# Patient Record
Sex: Male | Born: 1995 | Race: White | Hispanic: No | Smoking: Never smoker
Health system: Southern US, Community
[De-identification: ages and names within clinical notes are randomized; demographics above are authoritative.]

## PROBLEM LIST (undated history)

## (undated) DIAGNOSIS — Z8042 Family history of malignant neoplasm of prostate: Secondary | ICD-10-CM

## (undated) DIAGNOSIS — R0789 Other chest pain: Secondary | ICD-10-CM

## (undated) DIAGNOSIS — Z801 Family history of malignant neoplasm of trachea, bronchus and lung: Secondary | ICD-10-CM

## (undated) DIAGNOSIS — G8929 Other chronic pain: Secondary | ICD-10-CM

## (undated) DIAGNOSIS — Z8051 Family history of malignant neoplasm of kidney: Secondary | ICD-10-CM

## (undated) DIAGNOSIS — Z800 Family history of malignant neoplasm of digestive organs: Secondary | ICD-10-CM

## (undated) DIAGNOSIS — Q677 Pectus carinatum: Secondary | ICD-10-CM

## (undated) DIAGNOSIS — I517 Cardiomegaly: Secondary | ICD-10-CM

## (undated) DIAGNOSIS — J45909 Unspecified asthma, uncomplicated: Secondary | ICD-10-CM

## (undated) DIAGNOSIS — K219 Gastro-esophageal reflux disease without esophagitis: Secondary | ICD-10-CM

## (undated) HISTORY — DX: Family history of malignant neoplasm of kidney: Z80.51

## (undated) HISTORY — PX: HERNIA REPAIR: SHX51

## (undated) HISTORY — DX: Other chest pain: R07.89

## (undated) HISTORY — DX: Family history of malignant neoplasm of digestive organs: Z80.0

## (undated) HISTORY — PX: OTHER SURGICAL HISTORY: SHX169

## (undated) HISTORY — DX: Family history of malignant neoplasm of prostate: Z80.42

## (undated) HISTORY — DX: Family history of malignant neoplasm of trachea, bronchus and lung: Z80.1

## (undated) HISTORY — PX: CHOLECYSTECTOMY: SHX55

---
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
## 2018-01-05 ENCOUNTER — Encounter (HOSPITAL_COMMUNITY): Payer: Self-pay | Admitting: Emergency Medicine

## 2018-01-05 ENCOUNTER — Emergency Department (HOSPITAL_COMMUNITY)
Admission: EM | Admit: 2018-01-05 | Discharge: 2018-01-05 | Disposition: A | Discharge status: Home / Self Care | Payer: Self-pay | Attending: Emergency Medicine | Admitting: Emergency Medicine

## 2018-01-05 DIAGNOSIS — Z79899 Other long term (current) drug therapy: Secondary | ICD-10-CM | POA: Insufficient documentation

## 2018-01-05 DIAGNOSIS — M545 Low back pain, unspecified: Secondary | ICD-10-CM

## 2018-01-05 MED ORDER — IBUPROFEN 800 MG PO TABS: 800.0000 mg | ORAL_TABLET | Freq: Three times a day (TID) | ORAL | 0 refills | Status: DC

## 2018-01-05 MED ORDER — IBUPROFEN 800 MG PO TABS
800.0000 mg | ORAL_TABLET | Freq: Once | ORAL | Status: AC
  Administered 2018-01-05: 800 mg via ORAL
  Filled 2018-01-05: qty 1

## 2018-01-05 MED ORDER — CYCLOBENZAPRINE HCL 10 MG PO TABS: 10.0000 mg | ORAL_TABLET | Freq: Two times a day (BID) | ORAL | 0 refills | Status: DC | PRN

## 2018-01-05 MED ORDER — CYCLOBENZAPRINE HCL 10 MG PO TABS
10.0000 mg | ORAL_TABLET | Freq: Once | ORAL | Status: AC
  Administered 2018-01-05: 10 mg via ORAL
  Filled 2018-01-05: qty 1

## 2018-01-05 NOTE — ED Provider Notes (Signed)
[facility] EMERGENCY DEPARTMENT Provider Note   CSN: [id number] Arrival date & time: 01/05/18  1801     History   Chief Complaint Chief Complaint  Patient presents with  . Back Pain    HPI Devin Smith is a 22 y.o. male presenting with right lower back pain which has been achy x 1 week but became more severe and sharp today and is consistent with the pain from a fall from a scissors lift at work about 4 months ago.  He reports being evaluated at [facility] and imaging was negative for acute bony injury.  He was treated with ibuprofen and flexeril which were very helpful.   Patient denies any new injury as the source of todays pain but does have to bend and walk frequently with his job.  There is no radiation of pain into his legs.  There has been no weakness or numbness in the lower extremities and no urinary or bowel retention or incontinence.  Patient does not have a history of cancer or IVDU.  The patient has had no medications prior to arrival.  The history is provided by the patient.    Past Medical History:  Diagnosis Date  . Cardiomegaly   . Chest wall pain, chronic   . Pectus carinatum     Patient Active Problem List   Diagnosis Date Noted  . Cardiomegaly 05/22/2015    Past Surgical History:  Procedure Laterality Date  . Ravitch for pecuts carinatum on 10/14/2011          Home Medications    Prior to Admission medications   Medication Sig Start Date End Date Taking? Authorizing Provider  cyclobenzaprine (FLEXERIL) 10 MG tablet Take 1 tablet (10 mg total) by mouth 2 (two) times daily as needed for muscle spasms. 01/05/18   Burgess Amor, PA-C  ibuprofen (ADVIL,MOTRIN) 800 MG tablet Take 1 tablet (800 mg total) by mouth 3 (three) times daily. 01/05/18   Burgess Amor, PA-C  methocarbamol (ROBAXIN) 500 MG tablet Take 2 tablets (1,000 mg total) by mouth 4 (four) times daily as needed for muscle spasms (muscle spasm/pain). Patient not taking: Reported on 08/31/2016  07/20/16   Samuel Jester, DO  ondansetron (ZOFRAN ODT) 8 MG disintegrating tablet Take 1 tablet (8 mg total) by mouth every 8 (eight) hours as needed for nausea or vomiting. 08/31/16   Linwood Dibbles, MD  predniSONE (DELTASONE) 20 MG tablet Take 20 mg by mouth 2 (two) times daily.    [provider]    Family History Family History  Problem Relation Age of Onset  . Hypertension Mother   . Diabetes Mother   . Kidney cancer Mother   . Hyperlipidemia Mother   . Stomach cancer Father   . Brain cancer Father   . Hypertension Father   . Hyperlipidemia Father   . Thyroid disease Sister   . Heart failure Maternal Grandmother   . Heart disease Maternal Grandmother   . Heart disease Paternal Grandmother   . Heart failure Paternal Grandmother     Social History Social History   Tobacco Use  . Smoking status: Never Smoker  . Smokeless tobacco: Never Used  Substance Use Topics  . Alcohol use: No  . Drug use: No     Allergies   Patient has no known allergies.   Review of Systems Review of Systems  Constitutional: Negative for fever.  Respiratory: Negative for shortness of breath.   Cardiovascular: Negative for chest pain and leg swelling.  Gastrointestinal: Negative  for abdominal distention, abdominal pain and constipation.  Genitourinary: Negative for difficulty urinating, dysuria, flank pain, frequency and urgency.  Musculoskeletal: Positive for back pain. Negative for gait problem and joint swelling.  Skin: Negative for rash.  Neurological: Negative for weakness and numbness.     Physical Exam Updated Vital Signs BP 127/64 (BP Location: Right Arm)   Pulse 98   Temp 97.8 F (36.6 C) (Oral)   Resp 18   Ht 6\' 3"  (1.905 m)   Wt 136.1 kg   SpO2 100%   BMI 37.50 kg/m   Physical Exam  Constitutional: He appears well-developed and well-nourished.  HENT:  Head: Normocephalic.  Eyes: Conjunctivae are normal.  Neck: Normal range of motion. Neck supple.    Cardiovascular: Normal rate and intact distal pulses.  Pedal pulses normal.  Pulmonary/Chest: Effort normal.  Abdominal: Soft. Bowel sounds are normal. He exhibits no distension.  Musculoskeletal: Normal range of motion. He exhibits no edema.       Lumbar back: He exhibits bony tenderness. He exhibits no swelling, no edema and no spasm.       Back:  Patient has tenderness to palpation midline lower lumbar with radiation toward the right lower soft tissue.  There is no palpable deformity, edema or erythema.  Neurological: He is alert. He has normal strength. He displays no atrophy and no tremor. No sensory deficit. Gait normal.  Reflex Scores:      Patellar reflexes are 2+ on the right side and 2+ on the left side.      Achilles reflexes are 2+ on the right side and 2+ on the left side. No strength deficit noted in hip and knee flexor and extensor muscle groups.  Ankle flexion and extension intact.  Skin: Skin is warm and dry.  Psychiatric: He has a normal mood and affect.  Nursing note and vitals reviewed.    ED Treatments / Results  Labs (all labs ordered are listed, but only abnormal results are displayed) Labs Reviewed - No data to display  EKG None  Radiology No results found.  Procedures Procedures (including critical care time)  Medications Ordered in ED Medications  ibuprofen (ADVIL,MOTRIN) tablet 800 mg (800 mg Oral Given 01/05/18 1846)  cyclobenzaprine (FLEXERIL) tablet 10 mg (10 mg Oral Given 01/05/18 1845)     Initial Impression / Assessment and Plan / ED Course  I have reviewed the triage vital signs and the nursing notes.  Pertinent labs & imaging results that were available during my care of the patient were reviewed by me and considered in my medical decision making (see chart for details).     No neuro deficit on exam or by history to suggest emergent or surgical presentation.  Also discussed worsened sx that should prompt immediate re-evaluation  including distal weakness, bowel/bladder retention/incontinence.  Patient was advised ice and heat therapy, follow-up with his PCP for recheck within 1 week if not resolved.        Final Clinical Impressions(s) / ED Diagnoses   Final diagnoses:  Acute right-sided low back pain without sciatica    ED Discharge Orders         Ordered    cyclobenzaprine (FLEXERIL) 10 MG tablet  2 times daily PRN     01/05/18 1846    ibuprofen (ADVIL,MOTRIN) 800 MG tablet  3 times daily     01/05/18 1846           Burgess Amor, PA-C 01/05/18 1846    Loren Racer, MD  01/07/18 1606  

## 2018-01-05 NOTE — Discharge Instructions (Signed)
Do not drive within 4 hours of taking flexeril  as this can make you drowsy.  Avoid lifting,  Bending,  Twisting or any other activity that worsens your pain over the next week.  Apply an  icepack  to your lower back for 10-15 minutes every 2 hours for the next 2 days, you may then try applying a heating pad for 20 minutes several times daily.  You should get rechecked if your symptoms are not better over the next 5 days,  Or you develop increased pain,  Weakness in your leg(s) or loss of bladder or bowel function - these are symptoms of a worse injury.

## 2018-01-05 NOTE — ED Triage Notes (Signed)
Low back pain x 3 months since falling off machine at work.  Pt states it has gotten worse in the last 2 days

## 2018-05-16 IMAGING — CT CT RENAL STONE PROTOCOL
2 of 5 series · 16 of 46 positions shown, 18 images · non-contrast
Comparison: None.

CLINICAL DATA: Acute onset of left groin pain.  Initial encounter.

EXAM:
CT ABDOMEN AND PELVIS WITHOUT CONTRAST
TECHNIQUE: Multidetector CT imaging of the abdomen and pelvis was performed
following the standard protocol without IV contrast.

[Series 3: axial st · axial · 0.78mm/px · z∈[+821,+1296]mm · 13 of 107 slices shown, 15 images]
[im 6/107  soft-tissue]
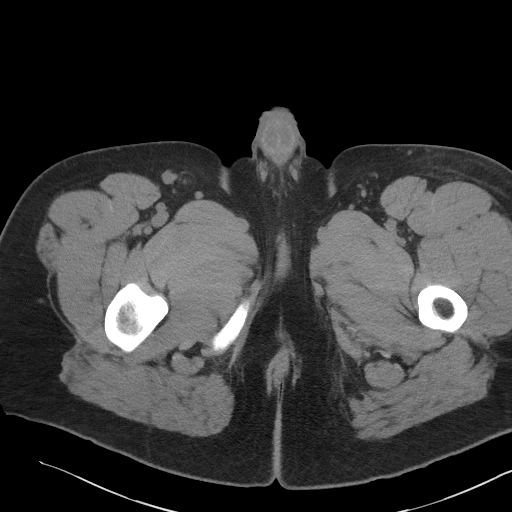
[im 6/107  bone]
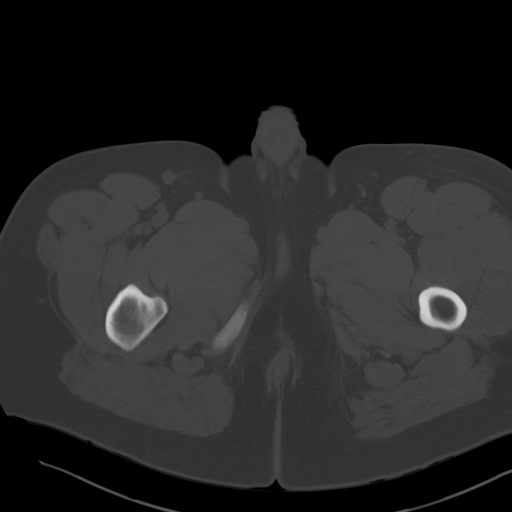
[im 16/107  soft-tissue]
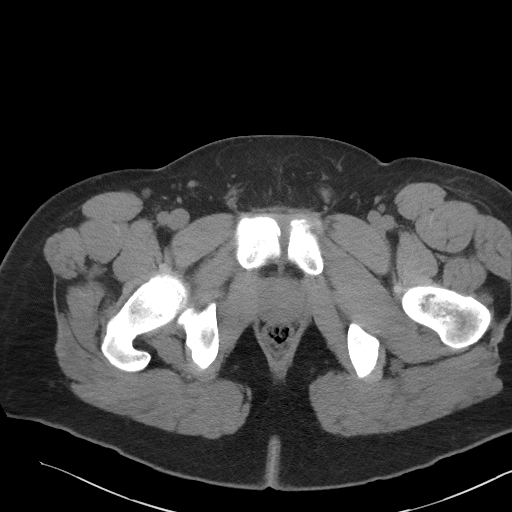
[im 22/107  soft-tissue]
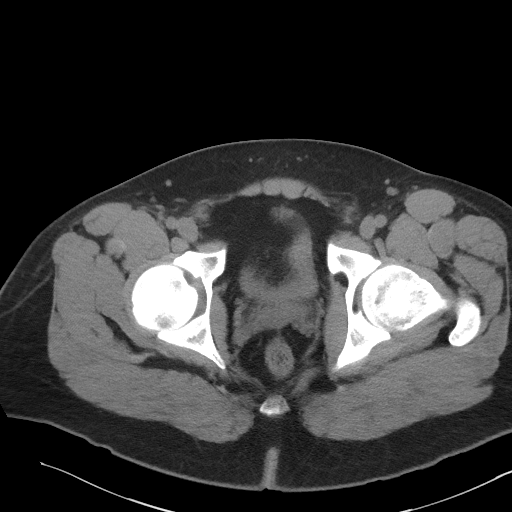
[im 32/107  soft-tissue]
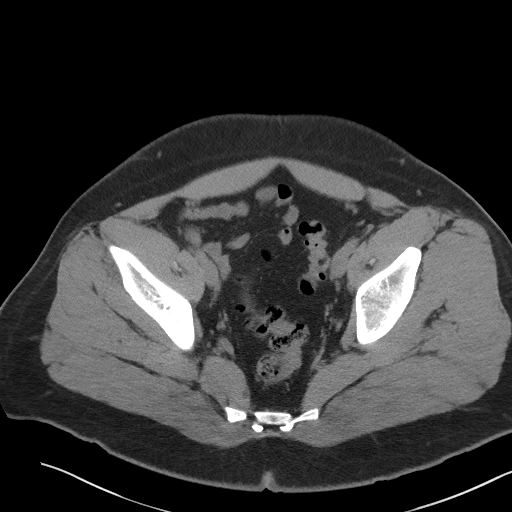
[im 38/107  soft-tissue]
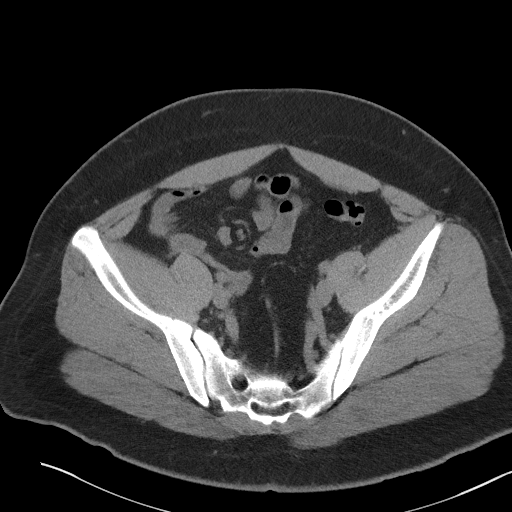
[im 48/107  soft-tissue]
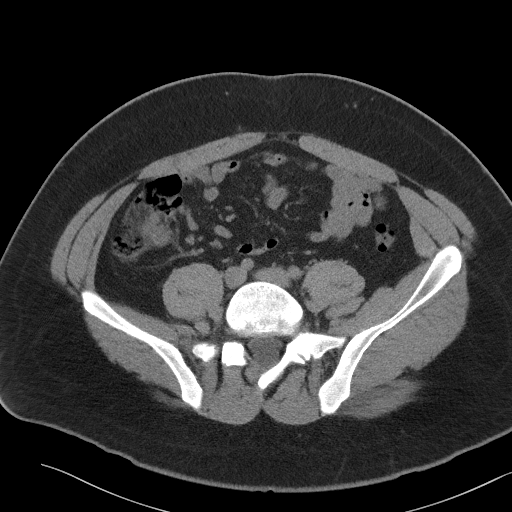
[im 54/107  soft-tissue]
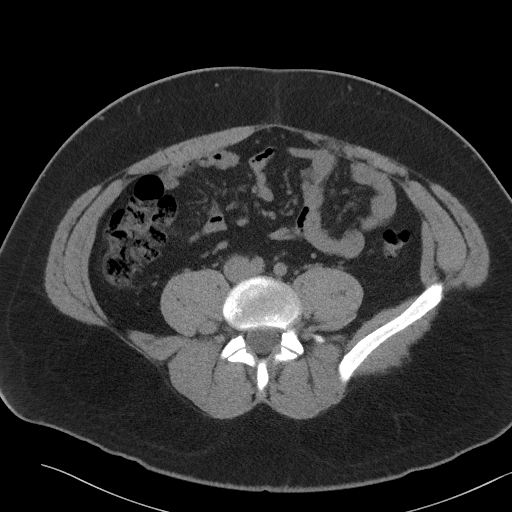
[im 59/107  soft-tissue]
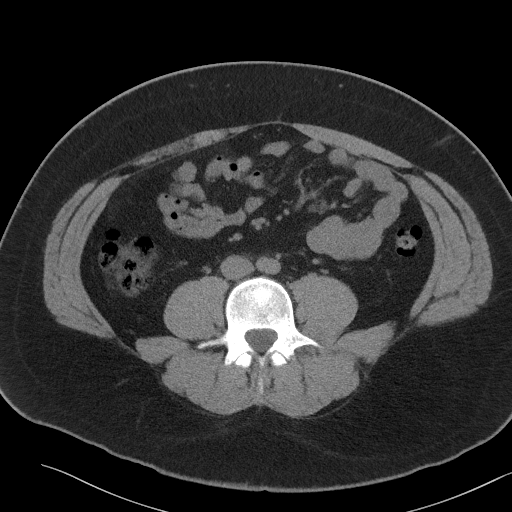
[im 69/107  soft-tissue]
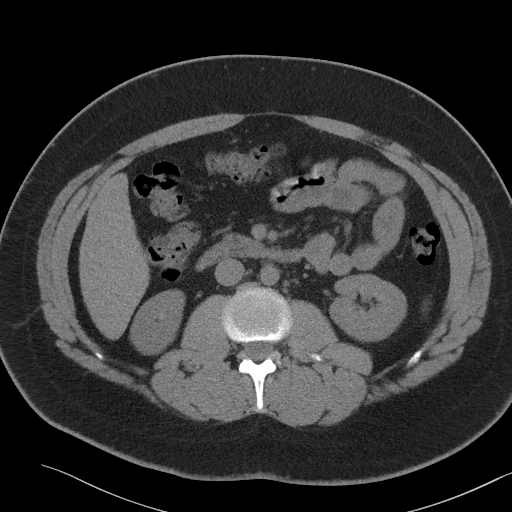
[im 69/107  bone]
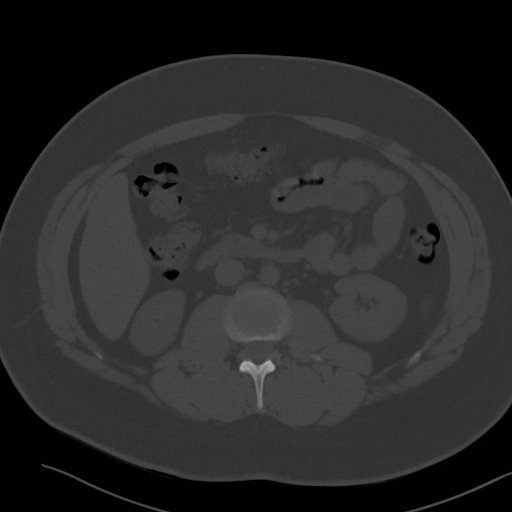
[im 75/107  soft-tissue]
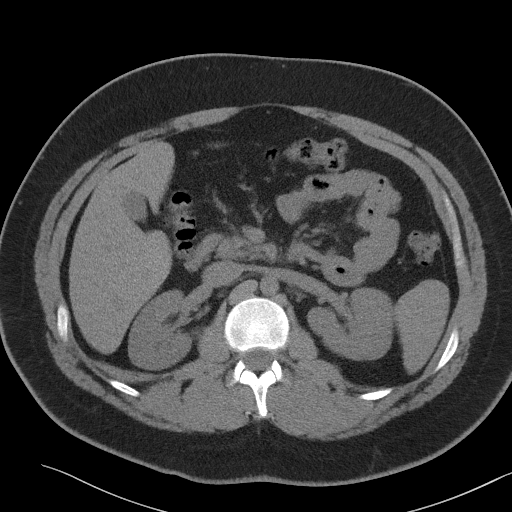
[im 85/107  soft-tissue]
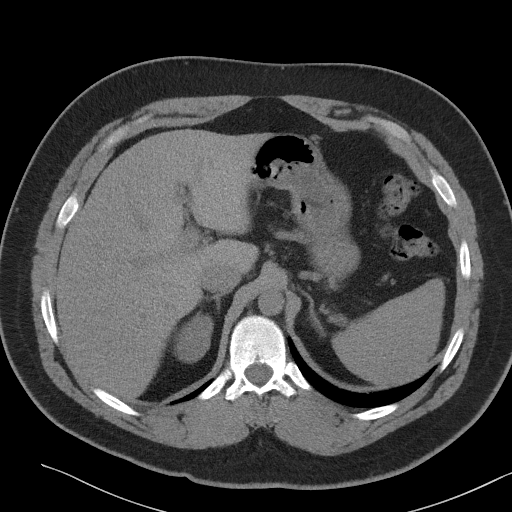
[im 91/107  soft-tissue]
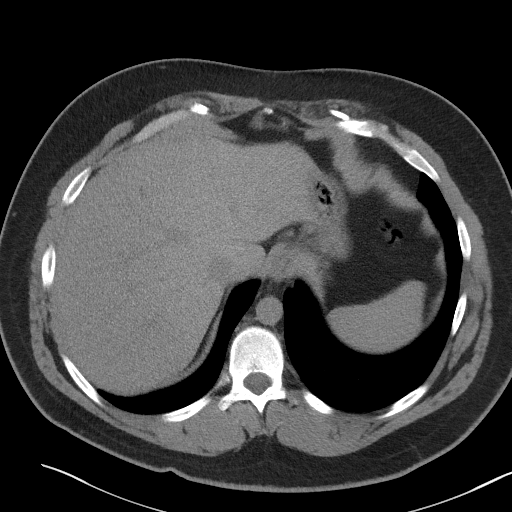
[im 101/107  soft-tissue]
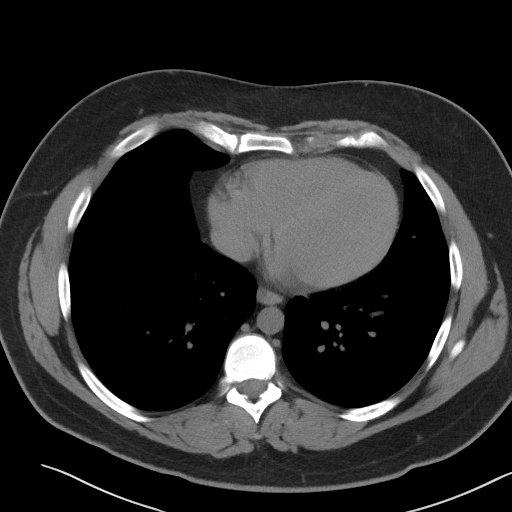

[Series 8: coronal st · coronal · 0.87mm/px · 3 of 101 slices shown]
[im 34/101  soft-tissue]
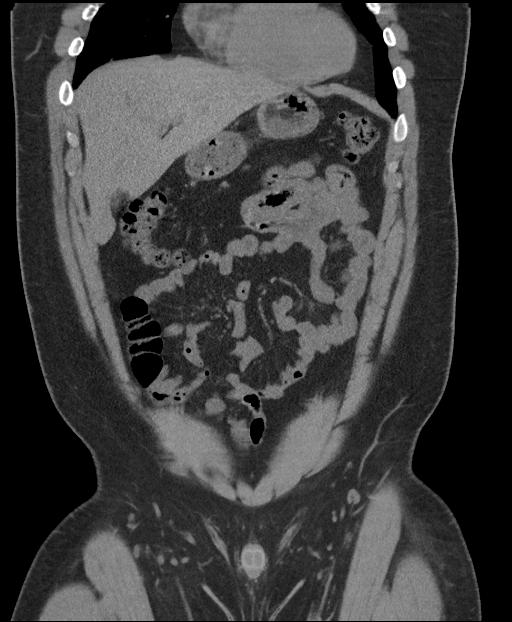
[im 45/101  soft-tissue]
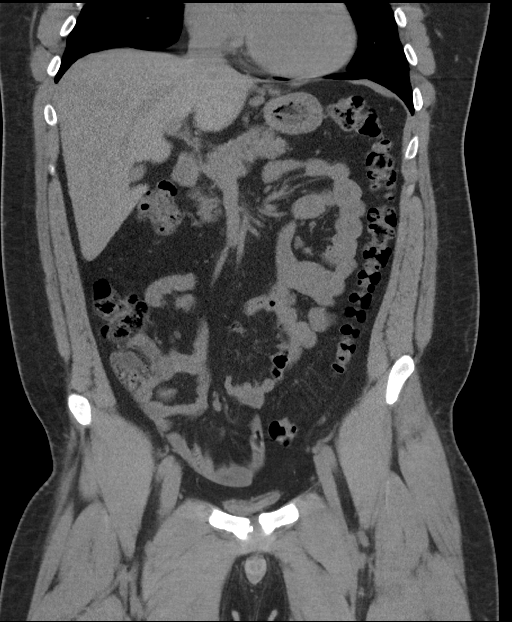
[im 56/101  soft-tissue]
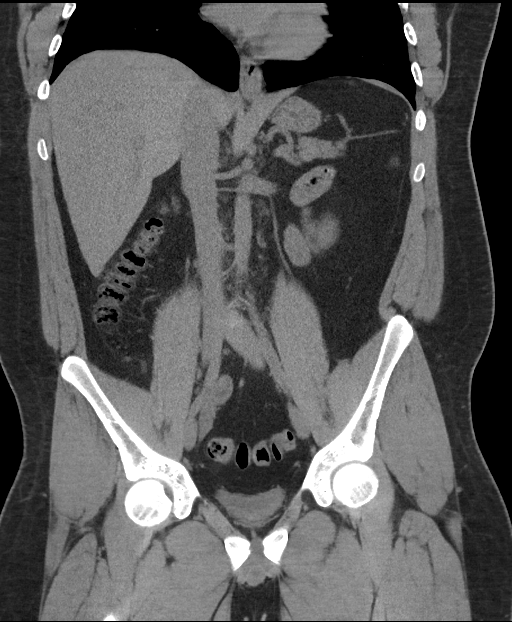

[16 of 46 positions shown; findings below may reference images not displayed]

FINDINGS: Lower chest: The visualized lung bases are grossly clear. The
visualized portions of the mediastinum are unremarkable.

Hepatobiliary: The liver is unremarkable in appearance. The
gallbladder is unremarkable in appearance. The common bile duct
remains normal in caliber.

Pancreas: The pancreas is within normal limits.

Spleen: The spleen is unremarkable in appearance.

Adrenals/Urinary Tract: The adrenal glands are unremarkable in
appearance. The kidneys are within normal limits. There is no
evidence of hydronephrosis. No renal or ureteral stones are
identified. No perinephric stranding is seen.

Stomach/Bowel: The stomach is unremarkable in appearance. The small
bowel is within normal limits. The appendix is normal in caliber,
without evidence of appendicitis. The colon is unremarkable in
appearance.

Vascular/Lymphatic: The abdominal aorta is unremarkable in
appearance. The inferior vena cava is grossly unremarkable. No
retroperitoneal lymphadenopathy is seen. No pelvic sidewall
lymphadenopathy is identified.

Reproductive: The bladder is decompressed and not well assessed. The
prostate remains normal in size.

Other: Mild soft tissue inflammation is noted along the anterior
proximal left thigh. This may reflect mild soft tissue injury. Would
correlate for any skin findings.

Musculoskeletal: No acute osseous abnormalities are identified. The
visualized musculature is unremarkable in appearance.
IMPRESSION: 1. Mild soft tissue inflammation along the anterior proximal left
thigh. This may reflect mild soft tissue injury. Would correlate for
any associated skin findings.
2. Otherwise unremarkable noncontrast CT of the abdomen and pelvis.

## 2018-06-11 ENCOUNTER — Emergency Department (HOSPITAL_COMMUNITY)
Admission: EM | Admit: 2018-06-11 | Discharge: 2018-06-11 | Disposition: A | Discharge status: Home / Self Care | Payer: 59 | Attending: Emergency Medicine | Admitting: Emergency Medicine

## 2018-06-11 ENCOUNTER — Other Ambulatory Visit: Payer: Self-pay

## 2018-06-11 ENCOUNTER — Encounter (HOSPITAL_COMMUNITY): Payer: Self-pay | Admitting: Emergency Medicine

## 2018-06-11 DIAGNOSIS — Z79899 Other long term (current) drug therapy: Secondary | ICD-10-CM | POA: Insufficient documentation

## 2018-06-11 DIAGNOSIS — R1011 Right upper quadrant pain: Secondary | ICD-10-CM

## 2018-06-11 LAB — CBC
HCT: 44.3 % (ref 39.0–52.0)
Hemoglobin: 15.1 g/dL (ref 13.0–17.0)
MCH: 29.1 pg (ref 26.0–34.0)
MCHC: 34.1 g/dL (ref 30.0–36.0)
MCV: 85.4 fL (ref 80.0–100.0)
Platelets: 370 10*3/uL (ref 150–400)
RBC: 5.19 MIL/uL (ref 4.22–5.81)
RDW: 11.6 % (ref 11.5–15.5)
WBC: 5.6 10*3/uL (ref 4.0–10.5)
nRBC: 0 % (ref 0.0–0.2)

## 2018-06-11 LAB — LIPASE, BLOOD: Lipase: 26 U/L (ref 11–51)

## 2018-06-11 LAB — COMPREHENSIVE METABOLIC PANEL
ALT: 35 U/L (ref 0–44)
AST: 27 U/L (ref 15–41)
Albumin: 4.6 g/dL (ref 3.5–5.0)
Alkaline Phosphatase: 48 U/L (ref 38–126)
Anion gap: 10 (ref 5–15)
BUN: 10 mg/dL (ref 6–20)
CO2: 25 mmol/L (ref 22–32)
Calcium: 9.7 mg/dL (ref 8.9–10.3)
Chloride: 104 mmol/L (ref 98–111)
Creatinine, Ser: 1.05 mg/dL (ref 0.61–1.24)
GFR calc Af Amer: >60 mL/min (ref >60)
GFR calc non Af Amer: >60 mL/min (ref >60)
Glucose, Bld: 86 mg/dL (ref 70–99)
Potassium: 3.8 mmol/L (ref 3.5–5.1)
Sodium: 139 mmol/L (ref 135–145)
Total Bilirubin: 0.4 mg/dL (ref 0.3–1.2)
Total Protein: 8.1 g/dL (ref 6.5–8.1)

## 2018-06-11 LAB — URINALYSIS, ROUTINE W REFLEX MICROSCOPIC
Bilirubin Urine: NEGATIVE
Glucose, UA: NEGATIVE mg/dL
Hgb urine dipstick: NEGATIVE
Ketones, ur: 5 mg/dL — AB
Leukocytes,Ua: NEGATIVE
Nitrite: NEGATIVE
Protein, ur: NEGATIVE mg/dL
Specific Gravity, Urine: 1.024 (ref 1.005–1.030)
pH: 6 (ref 5.0–8.0)

## 2018-06-11 MED ORDER — OXYCODONE-ACETAMINOPHEN 5-325 MG PO TABS
1.0000 | ORAL_TABLET | Freq: Once | ORAL | Status: AC
  Administered 2018-06-11: 1 via ORAL
  Filled 2018-06-11: qty 1

## 2018-06-11 NOTE — ED Notes (Signed)
Pt states his gall bladder is causing the problems. Went to [facility] for this issue, was told he did not meet criteria to have his Gall bladder removed.

## 2018-06-11 NOTE — ED Triage Notes (Signed)
Pt reports that he has been to [facility] and was told to follow up with GI doctor for worsening gallbladder pain. Reports only functioning at 22%. Reports a little diarrhea denies n/v.

## 2018-06-11 NOTE — Discharge Instructions (Addendum)
Your laboratory results today were within normal limits.  I have consulted our general surgeon who cannot schedule this removal at this time due to the COVID-19 crisis.  You will need to take your pain medication, you may also take Tylenol or ibuprofen to help with your symptoms.  Please follow-up with your gastroenterologist or physician back in [place].

## 2018-06-11 NOTE — ED Provider Notes (Signed)
[facility]-EMERGENCY DEPT Provider Note   CSN: [id number] Arrival date & time: 06/11/18  1521    History   Chief Complaint Chief Complaint  Patient presents with  . Abdominal Pain  . Diarrhea    HPI Devin Smith is a 23 y.o. male.     23 y.o male with a PMH of Cardiomegaly presents to the ED with a chief complaint of abdominal pain x 3 days. Patient reports he was seen at his PCP 3 days ago for abdominal pain along with pain after eating. He reports pain along the right upper quadrant, reports his pain is worse after eating.  He reports his last meal was yesterday which consisted of chicken along with Posta, reports this is not like his regular eating habits.  He reports he has had decreased in eating due to the pain.  He did have a HIDA scan along with an ultrasound which showed 22% EF functioning gallbladder.  Patient was advised to follow-up with gastroenterologist who is currently in [place].  Patient used to live in [facility] [place] [facility] but has now moved to [place].  Patient has been taking Percocet for his pain, reports this is somewhat helping but he can no longer go like this.  Patient was seen at [facility] today, was advised by the surgeons that he did not need urgent surgical requirement for a cholecystectomy.  Patient denies any fever, vomiting, diarrhea.     Past Medical History:  Diagnosis Date  . Cardiomegaly   . Chest wall pain, chronic   . Pectus carinatum     Patient Active Problem List   Diagnosis Date Noted  . Cardiomegaly 05/22/2015    Past Surgical History:  Procedure Laterality Date  . Ravitch for pecuts carinatum on 10/14/2011          Home Medications    Prior to Admission medications   Medication Sig Start Date End Date Taking? Authorizing Provider  dicyclomine (BENTYL) 20 MG tablet Take 20 mg by mouth every 6 (six) hours as needed for spasms.   Yes [provider]  oxyCODONE-acetaminophen (PERCOCET/ROXICET)  5-325 MG tablet Take 1 tablet by mouth every 8 (eight) hours as needed for moderate pain.  06/08/18 06/13/18 Yes [provider]  pantoprazole (PROTONIX) 40 MG tablet Take 40 mg by mouth daily.   Yes [provider]  cyclobenzaprine (FLEXERIL) 10 MG tablet Take 1 tablet (10 mg total) by mouth 2 (two) times daily as needed for muscle spasms. Patient not taking: Reported on 06/11/2018 01/05/18   Burgess Amor, PA-C  ibuprofen (ADVIL,MOTRIN) 800 MG tablet Take 1 tablet (800 mg total) by mouth 3 (three) times daily. Patient not taking: Reported on 06/11/2018 01/05/18   Burgess Amor, PA-C  methocarbamol (ROBAXIN) 500 MG tablet Take 2 tablets (1,000 mg total) by mouth 4 (four) times daily as needed for muscle spasms (muscle spasm/pain). Patient not taking: Reported on 08/31/2016 07/20/16   Samuel Jester, DO  ondansetron (ZOFRAN ODT) 8 MG disintegrating tablet Take 1 tablet (8 mg total) by mouth every 8 (eight) hours as needed for nausea or vomiting. Patient not taking: Reported on 06/11/2018 08/31/16   Linwood Dibbles, MD    Family History Family History  Problem Relation Age of Onset  . Hypertension Mother   . Diabetes Mother   . Kidney cancer Mother   . Hyperlipidemia Mother   . Stomach cancer Father   . Brain cancer Father   . Hypertension Father   . Hyperlipidemia Father   .  Thyroid disease Sister   . Heart failure Maternal Grandmother   . Heart disease Maternal Grandmother   . Heart disease Paternal Grandmother   . Heart failure Paternal Grandmother     Social History Social History   Tobacco Use  . Smoking status: Never Smoker  . Smokeless tobacco: Never Used  Substance Use Topics  . Alcohol use: No  . Drug use: No     Allergies   Patient has no known allergies.   Review of Systems Review of Systems  Constitutional: Negative for chills and fever.  HENT: Negative for ear pain and sore throat.   Eyes: Negative for pain and visual disturbance.  Respiratory:  Negative for cough and shortness of breath.   Cardiovascular: Negative for chest pain and palpitations.  Gastrointestinal: Positive for abdominal pain. Negative for blood in stool, diarrhea, nausea and vomiting.  Genitourinary: Negative for dysuria and hematuria.  Musculoskeletal: Negative for arthralgias and back pain.  Skin: Negative for color change and rash.  Neurological: Negative for seizures and syncope.  All other systems reviewed and are negative.    Physical Exam Updated Vital Signs BP 126/80 (BP Location: Left Arm)   Pulse 85   Temp 98.6 F (37 C) (Oral)   Resp 18   SpO2 99%   Physical Exam Vitals signs and nursing note reviewed.  Constitutional:      Appearance: He is well-developed.     Comments: Well appearing.   HENT:     Head: Normocephalic and atraumatic.  Eyes:     General: No scleral icterus.    Pupils: Pupils are equal, round, and reactive to light.  Neck:     Musculoskeletal: Normal range of motion.  Cardiovascular:     Heart sounds: Normal heart sounds.  Pulmonary:     Effort: Pulmonary effort is normal.     Breath sounds: Normal breath sounds. No wheezing.     Comments: Lungs are clear to auscultation. Chest:     Chest wall: No tenderness.     Comments: No tenderness to palpation along the chest wall. Abdominal:     General: Bowel sounds are normal. There is no distension.     Palpations: Abdomen is soft.     Tenderness: There is abdominal tenderness in the right upper quadrant and epigastric area. There is no right CVA tenderness or left CVA tenderness.  Musculoskeletal:        General: No tenderness or deformity.  Skin:    General: Skin is warm and dry.  Neurological:     Mental Status: He is alert and oriented to person, place, and time.      ED Treatments / Results  Labs (all labs ordered are listed, but only abnormal results are displayed) Labs Reviewed  LIPASE, BLOOD  COMPREHENSIVE METABOLIC PANEL  CBC  URINALYSIS, ROUTINE W  REFLEX MICROSCOPIC    EKG None  Radiology No results found.  Procedures Procedures (including critical care time)  Medications Ordered in ED Medications  oxyCODONE-acetaminophen (PERCOCET/ROXICET) 5-325 MG per tablet 1 tablet (has no administration in time range)     Initial Impression / Assessment and Plan / ED Course  I have reviewed the triage vital signs and the nursing notes.  Pertinent labs & imaging results that were available during my care of the patient were reviewed by me and considered in my medical decision making (see chart for details).    Patient with no previous medical history presents to the ED with right upper quadrant pain.  Patient was seen by PCP and eating 4 days ago, had blood work which showed no leukocytosis.  He was also sent for a ultrasound along with a HIDA scan which showed EF of 22% of his gallbladder.  Findings also revealed a common bile duct with a diameter of 3 mm, calculated gallbladder ejection fraction is 22%.  Today he reports being seen at [facility] where he was told he could not have surgery due to this pain not emergent at this time, and according to recent [place] crisis unable to schedule for surgery.  He was encouraged to follow-up with his gastroenterologist in [place].  Patient reports he has been unable to eat, reports his last meal was yesterday which consisted of chicken along with Posta, he reports he usually eats more than just 1 piece of chicken.  No vomiting episodes, patient appears well, no jaundice.  Labs were repeated today for comparison.  CBC showed no leukocytosis, hemoglobin is unremarkable.  CMP showed no electrolyte abnormality, LFTs are within normal limits.  Will place call for general surgery for their recommendations.  4:45 PM Spoke to Dr. Ezzard Standing, states no leukocytosis, patient is afebrile he will not have urgent cholecystectomy at this time.  5:23 PM patient remains afebrile, provided with Percocet for  symptomatic relief.  He has been instructed at this time there will be no surgical intervention.  He is to follow-up with his general surgeon at home.  He is to also have a bland diet.  Patient is currently on Protonix, with Bentyl as well.   Discussion with patient who is very upset at the situation, I have explained to him several times that we cannot schedule an elective surgery as he is currently not septic from his cholelithiasis.  Patient is stating "what if my GI doctor says that you need to do it ", I have advised him that at this time no surgeon under her network or the [facility] network is able to schedule an elective cholecystectomy at this time.  Patient will be discharged at this time, he remains afebrile.  Return precautions provided.  Follow-up with gastroenterology encouraged.  Final Clinical Impressions(s) / ED Diagnoses   Final diagnoses:  Right upper quadrant abdominal pain    ED Discharge Orders    None       Claude Manges, Cordelia Poche 06/11/18 1730    Benjiman Core, MD 06/11/18 2329

## 2018-07-13 HISTORY — PX: OTHER SURGICAL HISTORY: SHX169

## 2018-11-26 ENCOUNTER — Encounter: Payer: Self-pay | Admitting: Gastroenterology

## 2018-12-19 ENCOUNTER — Encounter: Payer: Self-pay | Admitting: *Deleted

## 2018-12-19 ENCOUNTER — Telehealth: Payer: Self-pay | Admitting: *Deleted

## 2018-12-19 ENCOUNTER — Encounter: Payer: Self-pay | Admitting: Nurse Practitioner

## 2018-12-19 ENCOUNTER — Other Ambulatory Visit: Payer: Self-pay

## 2018-12-19 ENCOUNTER — Other Ambulatory Visit: Payer: Self-pay | Admitting: *Deleted

## 2018-12-19 ENCOUNTER — Ambulatory Visit (INDEPENDENT_AMBULATORY_CARE_PROVIDER_SITE_OTHER): Payer: PRIVATE HEALTH INSURANCE | Admitting: Nurse Practitioner

## 2018-12-19 DIAGNOSIS — K625 Hemorrhage of anus and rectum: Secondary | ICD-10-CM

## 2018-12-19 DIAGNOSIS — Z800 Family history of malignant neoplasm of digestive organs: Secondary | ICD-10-CM

## 2018-12-19 HISTORY — DX: Family history of malignant neoplasm of digestive organs: Z80.0

## 2018-12-19 HISTORY — DX: Hemorrhage of anus and rectum: K62.5

## 2018-12-19 NOTE — Progress Notes (Addendum)
REVIEWED-TCS W/ MAC ASAP. 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  Primary Care Physician:  [facility] Primary Gastroenterologist:  Dr. Oneida Alar  Chief Complaint  Patient presents with  . Rectal Bleeding    family hx colon cancer    HPI:   Devin Smith is a 23 y.o. male who presents from primary care information provided with referral including office visit note dated 11/13/2018.  At that time noted rectal bleeding for 7 days, intermittent sweats October 2019.  Abdominal pain, skin change, nausea, vomiting.  Family history of prostate cancer in father and brother with initial symptoms of rectal bleeding at which point day recommended PSA testing.  Also recommended referral to GI due to family history of colon cancer in his father and brother at age is 80 and 25  Reviewed labs including PSA (normal).  CMP was essentially normal, CBC also essentially normal with a hemoglobin of 15.1.  History of colonoscopy or endoscopy in our system.  Today he states he's doing ok. Dad was diagnosed with CRC at age 23 (who passed from it). Was told his brother was diagnosed at age 19 years old with CRC and was treated. Has had rectal bleeding in the commode; variable frequency, has recently slowed some. Has lower abdominal pain (sharp) when he has to have a bowel movement, typically improves after a bowel movement. States anything he eats "goes right through me" which started after cholecystectomy (07/20/2018). Denies N/V, fever, chills, unintentional weight loss. Denies URI or flu-like symptoms. Denies loss of sense of taste or smell. Denies chest pain, dyspnea, dizziness, lightheadedness, syncope, near syncope. Denies any other upper or lower GI symptoms.  Has previously seen GI in [place], [place] when he was having gallbladder issues.  Past Medical History:  Diagnosis Date  . Cardiomegaly   . Chest wall pain, chronic   . Pectus carinatum     Past Surgical History:  Procedure Laterality  Date  . Ravitch for pecuts carinatum on 10/14/2011      Current Outpatient Medications  Medication Sig Dispense Refill  . ibuprofen (ADVIL,MOTRIN) 800 MG tablet Take 1 tablet (800 mg total) by mouth 3 (three) times daily. (Patient taking differently: Take 800 mg by mouth as needed. ) 21 tablet 0  . ondansetron (ZOFRAN ODT) 8 MG disintegrating tablet Take 1 tablet (8 mg total) by mouth every 8 (eight) hours as needed for nausea or vomiting. (Patient taking differently: Take 8 mg by mouth as needed for nausea or vomiting. ) 12 tablet 0   No current facility-administered medications for this visit.     Allergies as of 12/19/2018  . (No Known Allergies)    Family History  Problem Relation Age of Onset  . Hypertension Mother   . Diabetes Mother   . Kidney cancer Mother   . Hyperlipidemia Mother   . Stomach cancer Father   . Brain cancer Father   . Hypertension Father   . Hyperlipidemia Father   . Colon cancer Father 39  . Thyroid disease Sister   . Colon cancer Brother 30       ? surgical treatment  . Prostate cancer Brother   . Heart failure Maternal Grandmother   . Heart disease Maternal Grandmother   . Heart disease Paternal Grandmother   . Heart failure Paternal Grandmother     Social History   Socioeconomic History  . Marital status: Married    Spouse name: Not on file  . Number of children: Not on file  .  Years of education: Not on file  . Highest education level: Not on file  Occupational History  . Not on file  Social Needs  . Financial resource strain: Not on file  . Food insecurity    Worry: Not on file    Inability: Not on file  . Transportation needs    Medical: Not on file    Non-medical: Not on file  Tobacco Use  . Smoking status: Never Smoker  . Smokeless tobacco: Never Used  Substance and Sexual Activity  . Alcohol use: No  . Drug use: No  . Sexual activity: Not on file  Lifestyle  . Physical activity    Days per week: Not on file    Minutes  per session: Not on file  . Stress: Not on file  Relationships  . Social Herbalist on phone: Not on file    Gets together: Not on file    Attends religious service: Not on file    Active member of club or organization: Not on file    Attends meetings of clubs or organizations: Not on file    Relationship status: Not on file  . Intimate partner violence    Fear of current or ex partner: Not on file    Emotionally abused: Not on file    Physically abused: Not on file    Forced sexual activity: Not on file  Other Topics Concern  . Not on file  Social History Narrative  . Not on file    Review of Systems: General: Negative for anorexia, weight loss, fever, chills, fatigue, weakness. ENT: Negative for hoarseness, difficulty swallowing. CV: Negative for chest pain, angina, palpitations, peripheral edema.  Respiratory: Negative for dyspnea at rest, cough, sputum, wheezing.  GI: See history of present illness. MS: Negative for joint pain, low back pain.  Derm: Negative for rash or itching.  Endo: Negative for unusual weight change.  Heme: Negative for bruising or bleeding. Allergy: Negative for rash or hives.    Physical Exam: BP 115/69   Pulse 71   Temp (!) 97.1 F (36.2 C) (Temporal)   Ht _0  (1.905 m)   Wt (!) 327 lb 12.8 oz (148.7 kg)   BMI 40.97 kg/m  General:   Obese male. Alert and oriented. Pleasant and cooperative. Well-nourished and well-developed.  Head:  Normocephalic and atraumatic. Eyes:  Without icterus, sclera clear and conjunctiva pink.  Ears:  Normal auditory acuity. Cardiovascular:  S1, S2 present without murmurs appreciated. Extremities without clubbing or edema. Respiratory:  Clear to auscultation bilaterally. No wheezes, rales, or rhonchi. No distress.  Gastrointestinal:  +BS, soft, non-tender and non-distended. No HSM noted. No guarding or rebound. No masses appreciated.  Rectal:  Deferred  Musculoskalatal:  Symmetrical without gross  deformities. Neurologic:  Alert and oriented x4;  grossly normal neurologically. Psych:  Alert and cooperative. Normal mood and affect. Heme/Lymph/Immune: No excessive bruising noted.    12/19/2018 8:26 AM   Disclaimer: This note was dictated with voice recognition software. Similar sounding words can inadvertently be transcribed and may not be corrected upon review.

## 2018-12-19 NOTE — Telephone Encounter (Signed)
Called pt. He is scheduled for TCS with MAC [facility] 10/19 at 10:15am. Patient was given clenpiq sample when he left. He is aware will mail instructions with pre-op and covid-19 testing appt. If he does not receive this, he is too call the office.   PA submitted to [facility]. Clinicals uploaded.

## 2018-12-19 NOTE — Patient Instructions (Signed)
Your health issues we discussed today were:   Family history of colon cancer and personal rectal bleeding: 1. We will proceed with a colonoscopy to further evaluate her symptoms 2. Further recommendations will be made after colonoscopy 3. We will work on finding an appropriate day and time for your procedure and will call to discuss scheduling at that time 4. Call us if you have any worsening or severe bleeding  Overall I recommend:  1. Continue your current medications 2. Follow-up in 3 months 3. Call us if you have any questions or concerns.   Because of recent events of COVID-19 ("Coronavirus"), follow CDC recommendations:  1. Wash your hand frequently 2. Avoid touching your face 3. Stay away from people who are sick 4. If you have symptoms such as fever, cough, shortness of breath then call your healthcare provider for further guidance 5. If you are sick, STAY AT HOME unless otherwise directed by your healthcare provider. 6. Follow directions from state and national officials regarding staying safe   At [facility] we value your feedback. You may receive a survey about your visit today. Please share your experience as we strive to create trusting relationships with our patients to provide genuine, compassionate, quality care.  We appreciate your understanding and patience as we review any laboratory studies, imaging, and other diagnostic tests that are ordered as we care for you. Our office policy is 5 business days for review of these results, and any emergent or urgent results are addressed in a timely manner for your best interest. If you do not hear from our office in 1 week, please contact us.   We also encourage the use of MyChart, which contains your medical information for your review as well. If you are not enrolled in this feature, an access code is on this after visit summary for your convenience. Thank you for allowing [place] to be involved in your care.  It  was great to see you today!  I hope you have a great Fall!!

## 2018-12-19 NOTE — Assessment & Plan Note (Signed)
The patient describes an approximate 1 year history of rectal bleeding.  He is also having more frequent diarrhea since cholecystectomy earlier this year.  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  This is quite concerning given his high risk family history as per below.  Given his family history and his symptoms we will proceed with a colonoscopy at this time.  Proceed with colonoscopy on propofol/MAC with Dr. Oneida Alar in the near future. The risks, benefits, and alternatives have been discussed in detail with the patient. They state understanding and desire to proceed.   The patient is not on any anticoagulants, anxiolytics, chronic pain medications, antidepressants, antidiabetics, or iron supplements.  Denies alcohol and drug use.  Given his young age and body habitus we will plan for propofol/MAC to promote adequate sedation.

## 2018-12-19 NOTE — Telephone Encounter (Signed)
LMOVM for pt advising of pre-op/covid-19 testing appt

## 2018-12-19 NOTE — Assessment & Plan Note (Signed)
Significant family history of colon cancer in his father who was diagnosed and passed away soon after at age 21 as well as in his brother who was diagnosed with colorectal cancer (per the patient's recollection) at age 35.  Technically he should have had his first colonoscopy at age 32.  He is now overdue and has had 1 year history of rectal bleeding as per above.  We will proceed with a colonoscopy, as per above.  Given significant family history of colorectal cancer and prostate cancer, at his next office visit we can discuss possible genetic testing.

## 2018-12-20 ENCOUNTER — Telehealth: Payer: Self-pay | Admitting: *Deleted

## 2018-12-20 NOTE — Telephone Encounter (Signed)
Received call from [employer] from [facility]. PA will be reviewed for TCS. Will call back with approval/denial

## 2018-12-20 NOTE — Telephone Encounter (Signed)
Received call from [facility] with [employer]. PA approved. Auth# [id number] DOS 12/31/2018.

## 2018-12-20 NOTE — Addendum Note (Signed)
Addended by: Cheron Every on: 12/20/2018 12:09 PM   Modules accepted: Orders

## 2018-12-26 ENCOUNTER — Other Ambulatory Visit: Payer: Self-pay

## 2018-12-26 ENCOUNTER — Encounter (HOSPITAL_COMMUNITY): Payer: Self-pay

## 2018-12-27 ENCOUNTER — Other Ambulatory Visit (HOSPITAL_COMMUNITY)
Admission: RE | Admit: 2018-12-27 | Discharge: 2018-12-27 | Disposition: A | Discharge status: Home / Self Care | Payer: PRIVATE HEALTH INSURANCE | Source: Ambulatory Visit | Attending: Gastroenterology | Admitting: Gastroenterology

## 2018-12-27 ENCOUNTER — Encounter (HOSPITAL_COMMUNITY)
Admission: RE | Admit: 2018-12-27 | Discharge: 2018-12-27 | Disposition: A | Discharge status: Home / Self Care | Payer: PRIVATE HEALTH INSURANCE | Source: Ambulatory Visit | Attending: Gastroenterology | Admitting: Gastroenterology

## 2018-12-27 DIAGNOSIS — Z20828 Contact with and (suspected) exposure to other viral communicable diseases: Secondary | ICD-10-CM | POA: Insufficient documentation

## 2018-12-27 DIAGNOSIS — Z01812 Encounter for preprocedural laboratory examination: Secondary | ICD-10-CM | POA: Diagnosis not present

## 2018-12-27 LAB — SARS CORONAVIRUS 2 (TAT 6-24 HRS): SARS Coronavirus 2: NEGATIVE

## 2018-12-28 ENCOUNTER — Telehealth: Payer: Self-pay | Admitting: General Practice

## 2018-12-28 NOTE — Telephone Encounter (Signed)
Gave patient covid test results °Patient understood  °

## 2018-12-31 ENCOUNTER — Ambulatory Visit (HOSPITAL_COMMUNITY): Payer: PRIVATE HEALTH INSURANCE | Admitting: Anesthesiology

## 2018-12-31 ENCOUNTER — Other Ambulatory Visit: Payer: Self-pay

## 2018-12-31 ENCOUNTER — Encounter (HOSPITAL_COMMUNITY): Payer: Self-pay | Admitting: Emergency Medicine

## 2018-12-31 ENCOUNTER — Ambulatory Visit (HOSPITAL_COMMUNITY)
Admission: RE | Admit: 2018-12-31 | Discharge: 2018-12-31 | Disposition: A | Discharge status: Home / Self Care | Payer: PRIVATE HEALTH INSURANCE | Attending: Gastroenterology | Admitting: Gastroenterology

## 2018-12-31 ENCOUNTER — Encounter (HOSPITAL_COMMUNITY)
Admission: RE | Disposition: A | Discharge status: Home / Self Care | Payer: Self-pay | Source: Home / Self Care | Attending: Gastroenterology

## 2018-12-31 DIAGNOSIS — K529 Noninfective gastroenteritis and colitis, unspecified: Secondary | ICD-10-CM | POA: Diagnosis not present

## 2018-12-31 DIAGNOSIS — J45909 Unspecified asthma, uncomplicated: Secondary | ICD-10-CM | POA: Insufficient documentation

## 2018-12-31 DIAGNOSIS — Z9049 Acquired absence of other specified parts of digestive tract: Secondary | ICD-10-CM | POA: Diagnosis not present

## 2018-12-31 DIAGNOSIS — R197 Diarrhea, unspecified: Secondary | ICD-10-CM | POA: Diagnosis not present

## 2018-12-31 DIAGNOSIS — K625 Hemorrhage of anus and rectum: Secondary | ICD-10-CM

## 2018-12-31 DIAGNOSIS — K644 Residual hemorrhoidal skin tags: Secondary | ICD-10-CM | POA: Insufficient documentation

## 2018-12-31 DIAGNOSIS — Q438 Other specified congenital malformations of intestine: Secondary | ICD-10-CM

## 2018-12-31 DIAGNOSIS — Z800 Family history of malignant neoplasm of digestive organs: Secondary | ICD-10-CM

## 2018-12-31 DIAGNOSIS — K921 Melena: Secondary | ICD-10-CM

## 2018-12-31 HISTORY — PX: BIOPSY: SHX5522

## 2018-12-31 HISTORY — PX: COLONOSCOPY WITH PROPOFOL: SHX5780

## 2018-12-31 SURGERY — COLONOSCOPY WITH PROPOFOL
Anesthesia: General

## 2018-12-31 MED ORDER — PROPOFOL 10 MG/ML IV BOLUS
INTRAVENOUS | Status: DC | PRN
  Administered 2018-12-31 (×2): 20 mg via INTRAVENOUS

## 2018-12-31 MED ORDER — CHLORHEXIDINE GLUCONATE CLOTH 2 % EX PADS: 6.0000 | MEDICATED_PAD | Freq: Once | CUTANEOUS | Status: DC

## 2018-12-31 MED ORDER — CALCIUM CARBONATE ANTACID 500 MG PO CHEW: CHEWABLE_TABLET | ORAL | 11 refills | Status: DC

## 2018-12-31 MED ORDER — MIDAZOLAM HCL 2 MG/2ML IJ SOLN: 0.5000 mg | Freq: Once | INTRAMUSCULAR | Status: DC | PRN

## 2018-12-31 MED ORDER — LIDOCAINE HCL (CARDIAC) PF 100 MG/5ML IV SOSY
PREFILLED_SYRINGE | INTRAVENOUS | Status: DC | PRN
  Administered 2018-12-31: 80 mg via INTRAVENOUS

## 2018-12-31 MED ORDER — LACTATED RINGERS IV SOLN: INTRAVENOUS | Status: DC

## 2018-12-31 MED ORDER — PROPOFOL 500 MG/50ML IV EMUL
INTRAVENOUS | Status: DC | PRN
  Administered 2018-12-31: 150 ug/kg/min via INTRAVENOUS
  Administered 2018-12-31: 10:00:00 via INTRAVENOUS

## 2018-12-31 MED ORDER — HYDROCODONE-ACETAMINOPHEN 7.5-325 MG PO TABS: 1.0000 | ORAL_TABLET | Freq: Once | ORAL | Status: DC | PRN

## 2018-12-31 MED ORDER — MINERAL OIL PO OIL
TOPICAL_OIL | ORAL | Status: AC
  Filled 2018-12-31: qty 30

## 2018-12-31 MED ORDER — LACTATED RINGERS IV SOLN
INTRAVENOUS | Status: DC | PRN
  Administered 2018-12-31: 09:00:00 via INTRAVENOUS

## 2018-12-31 MED ORDER — KETAMINE HCL 10 MG/ML IJ SOLN
INTRAMUSCULAR | Status: DC | PRN
  Administered 2018-12-31: 30 mg via INTRAVENOUS

## 2018-12-31 MED ORDER — KETAMINE HCL 50 MG/5ML IJ SOSY
PREFILLED_SYRINGE | INTRAMUSCULAR | Status: AC
Start: 2018-12-31 — End: ?
  Filled 2018-12-31: qty 5

## 2018-12-31 MED ORDER — HYDROMORPHONE HCL 1 MG/ML IJ SOLN: 0.2500 mg | INTRAMUSCULAR | Status: DC | PRN

## 2018-12-31 MED ORDER — PROMETHAZINE HCL 25 MG/ML IJ SOLN: 6.2500 mg | INTRAMUSCULAR | Status: DC | PRN

## 2018-12-31 MED ORDER — GLYCOPYRROLATE 0.2 MG/ML IJ SOLN
INTRAMUSCULAR | Status: DC | PRN
  Administered 2018-12-31: 0.2 mg via INTRAVENOUS

## 2018-12-31 NOTE — Op Note (Signed)
[facility] Patient Name: Devin AlbertCharles Smith Procedure Date: 12/31/2018 9:14 AM MRN: [id number] Date of Birth: [birth year] Attending MD: Jonette EvaSandi Neeya Prigmore MD, MD CSN: [id number] Age: [age] Admit Type: Ambulatory Procedure:                Colonoscopy WITH COLD FORCEPS BIOPSY Indications:              Hematochezia, POSTPRANDIAL Diarrhea AFTER GB                            REMOVED IN MAY 2020. NO RECTAL BLEEDING FOR 2-3                            WEEKS. Providers:                Jonette EvaSandi Handy Mcloud MD, MD, Buel ReamAngela A. Thomasena Edisollins RN, RN,                            Edythe ClarityKelly Cox, Technician Referring MD:             Selinda FlavinKevin Howard, MD Medicines:                Propofol per Anesthesia Complications:            No immediate complications. Estimated Blood Loss:     Estimated blood loss was minimal. Procedure:                Pre-Anesthesia Assessment:                           - Prior to the procedure, a History and Physical                            was performed, and patient medications and                            allergies were reviewed. The patient's tolerance of                            previous anesthesia was also reviewed. The risks                            and benefits of the procedure and the sedation                            options and risks were discussed with the patient.                            All questions were answered, and informed consent                            was obtained. Prior Anticoagulants: The patient has                            taken no previous anticoagulant or antiplatelet  agents except for NSAID medication. ASA Grade                            Assessment: II - A patient with mild systemic                            disease. After reviewing the risks and benefits,                            the patient was deemed in satisfactory condition to                            undergo the procedure. After obtaining informed   consent, the colonoscope was passed under direct                            vision. Throughout the procedure, the patient's                            blood pressure, pulse, and oxygen saturations were                            monitored continuously. The CF-[id number] ([id number])                            scope was introduced through the anus and advanced                            to the 15 cm into the ileum. The colonoscopy was                            somewhat difficult due to a tortuous colon.                            Successful completion of the procedure was aided by                            straightening and shortening the scope to obtain                            bowel loop reduction and COLOWRAP. The patient                            tolerated the procedure well. The quality of the                            bowel preparation was excellent. The terminal                            ileum, ileocecal valve, appendiceal orifice, and                            rectum were photographed. Scope In: 9:31:38 AM Scope Out: 9:49:27 AM  Scope Withdrawal Time: 0 hours 15 minutes 29 seconds  Total Procedure Duration: 0 hours 17 minutes 49 seconds  Findings:      The terminal ileum appeared normal. Biopsies were taken with a cold       forceps for histology.      The recto-sigmoid colon and sigmoid colon were mildly tortuous. Biopsies       for histology were taken with a cold forceps from the right colon, left       colon and rectum for evaluation of microscopic colitis.      External hemorrhoids were found. Impression:               - The examined portion of the ileum was normal.                            Biopsied.                           - Tortuous colon. Biopsied.                           - NO OBVIOUS SOURCE FOR RECTAL BLEEDING IDENTIFIED. Moderate Sedation:      Per Anesthesia Care Recommendation:           - Patient has a contact number available for                             emergencies. The signs and symptoms of potential                            delayed complications were discussed with the                            patient. Return to normal activities tomorrow.                            Written discharge instructions were provided to the                            patient.                           - Low fat diet. LOSE WEIGHT TO BMI < 30                           - Continue present medications.                           - Await pathology results.                           - Repeat colonoscopy at age 23 for surveillance.                           - Return to GI office in 6 months. Procedure Code(s):        --- Professional ---  45380, Colonoscopy, flexible; with biopsy, single                            or multiple Diagnosis Code(s):        --- Professional ---                           K92.1, Melena (includes Hematochezia)                           R19.7, Diarrhea, unspecified                           Q43.8, Other specified congenital malformations of                            intestine CPT copyright 2019 American Medical Association. All rights reserved. The codes documented in this report are preliminary and upon coder review may  be revised to meet current compliance requirements. Barney Drain, MD Barney Drain MD, MD 12/31/2018 10:19:12 AM This report has been signed electronically. Number of Addenda: 0

## 2018-12-31 NOTE — H&P (Signed)
Primary Care Physician:  [facility] Primary Gastroenterologist:  Dr. Darrick Penna  Pre-Procedure History & Physical: HPI:  Devin Smith is a 23 y.o. male here for  BRBPR.  Past Medical History:  Diagnosis Date  . Cardiomegaly   . Chest wall pain, chronic   . Pectus carinatum     Past Surgical History:  Procedure Laterality Date  . Ravitch for pecuts carinatum on 10/14/2011      Prior to Admission medications   Medication Sig Start Date End Date Taking? Authorizing Provider  acetaminophen (TYLENOL) 500 MG tablet Take 1,000 mg by mouth every 6 (six) hours as needed for moderate pain or headache.   Yes [provider]  albuterol (VENTOLIN HFA) 108 (90 Base) MCG/ACT inhaler Inhale 2 puffs into the lungs every 6 (six) hours as needed for wheezing or shortness of breath.   Yes [provider]  doxycycline (VIBRAMYCIN) 100 MG capsule Take 100 mg by mouth 2 (two) times daily.   Yes [provider]  ibuprofen (ADVIL) 200 MG tablet Take 600 mg by mouth every 6 (six) hours as needed for headache or moderate pain.   Yes [provider]    Allergies as of 12/19/2018  . (No Known Allergies)    Family History  Problem Relation Age of Onset  . Hypertension Mother   . Diabetes Mother   . Kidney cancer Mother   . Hyperlipidemia Mother   . Stomach cancer Father   . Brain cancer Father   . Hypertension Father   . Hyperlipidemia Father   . Colon cancer Father 73  . Thyroid disease Sister   . Colon cancer Brother 26       ? surgical treatment  . Prostate cancer Brother   . Heart failure Maternal Grandmother   . Heart disease Maternal Grandmother   . Heart disease Paternal Grandmother   . Heart failure Paternal Grandmother     Social History   Socioeconomic History  . Marital status: Married    Spouse name: Not on file  . Number of children: Not on file  . Years of education: Not on file  . Highest education level: Not on file   Occupational History  . Not on file  Social Needs  . Financial resource strain: Not on file  . Food insecurity    Worry: Not on file    Inability: Not on file  . Transportation needs    Medical: Not on file    Non-medical: Not on file  Tobacco Use  . Smoking status: Never Smoker  . Smokeless tobacco: Never Used  Substance and Sexual Activity  . Alcohol use: No  . Drug use: No  . Sexual activity: Not on file  Lifestyle  . Physical activity    Days per week: Not on file    Minutes per session: Not on file  . Stress: Not on file  Relationships  . Social Musician on phone: Not on file    Gets together: Not on file    Attends religious service: Not on file    Active member of club or organization: Not on file    Attends meetings of clubs or organizations: Not on file    Relationship status: Not on file  . Intimate partner violence    Fear of current or ex partner: Not on file    Emotionally abused: Not on file    Physically abused: Not on file    Forced sexual activity:  Not on file  Other Topics Concern  . Not on file  Social History Narrative  . Not on file    Review of Systems: See HPI, otherwise negative ROS   Physical Exam: BP (!) 160/95   Pulse 83   Temp 98 F (36.7 C) (Oral)   Resp 18   SpO2 100%  General:   Alert,  pleasant and cooperative in NAD Head:  Normocephalic and atraumatic. Neck:  Supple; Lungs:  Clear throughout to auscultation.    Heart:  Regular rate and rhythm. Abdomen:  Soft, nontender and nondistended. Normal bowel sounds, without guarding, and without rebound.   Neurologic:  Alert and  oriented x4;  grossly normal neurologically.  Impression/Plan:    BRBPR  PLAN: TCS TODAY. DISCUSSED PROCEDURE, BENEFITS, & RISKS: < 1% chance of medication reaction, bleeding, perforation, ASPIRATION, or rupture of spleen/liver requiring surgery to fix it and missed polyps < 1 cm 10-20% of the time.

## 2018-12-31 NOTE — Transfer of Care (Signed)
Immediate Anesthesia Transfer of Care Note  Patient: Devin Smith  Procedure(s) Performed: COLONOSCOPY WITH PROPOFOL (N/A ) BIOPSY  Patient Location: PACU  Anesthesia Type:General  Level of Consciousness: awake, oriented and patient cooperative  Airway & Oxygen Therapy: Patient Spontanous Breathing  Post-op Assessment: Report given to RN and Post -op Vital signs reviewed and stable  Post vital signs: Reviewed and stable  Last Vitals:  Vitals Value Taken Time  BP    Temp    Pulse 88 12/31/18 0955  Resp 24 12/31/18 0955  SpO2 96 % 12/31/18 0955  Vitals shown include unvalidated device data.  Last Pain:  Vitals:   12/31/18 0823  TempSrc: Oral  PainSc: 0-No pain         Complications: No apparent anesthesia complications

## 2018-12-31 NOTE — Anesthesia Postprocedure Evaluation (Signed)
Anesthesia Post Note  Patient: Devin Smith  Procedure(s) Performed: COLONOSCOPY WITH PROPOFOL (N/A ) BIOPSY  Patient location during evaluation: PACU Anesthesia Type: General Level of consciousness: awake and alert, oriented and patient cooperative Pain management: pain level controlled Vital Signs Assessment: post-procedure vital signs reviewed and stable Respiratory status: spontaneous breathing Cardiovascular status: stable Postop Assessment: no apparent nausea or vomiting Anesthetic complications: no     Last Vitals:  Vitals:   12/31/18 0823 12/31/18 0955  BP: (!) 160/95 112/72  Pulse: 83 88  Resp: 18 (!) 24  Temp: 36.7 C 36.5 C  SpO2: 100% 96%    Last Pain:  Vitals:   12/31/18 0823  TempSrc: Oral  PainSc: 0-No pain                 ADAMS, AMY A

## 2018-12-31 NOTE — Discharge Instructions (Signed)
THE LAST PART OF YOUR SMALL BOWEL IS NORMAL. YOUR LOOSE STOOLS ARE MOST LIKELY DUE TO NOT HAVING A GALLBALDDR. I BIOPSIED YOUR COLON AND SMALL BOWEL.   CHEW ONE TUMS WITH MEALS THREE TIMES A DAY.  USE IMODIUM IF NEEDED TO CONTROL DIARRHEA.  FOLLOW A LOW FAT DIET. MEATS SHOULD BE BAKED, BROILED, OR BOILED. AVOID FRIED AND FAST FOOD.   YOUR BIOPSY RESULTS WILL BE BACK IN 5 BUSINESS DAYS.  FOLLOW UP IN 4 MOS.     Colonoscopy Care After Read the instructions outlined below and refer to this sheet in the next week. These discharge instructions provide you with general information on caring for yourself after you leave the hospital. While your treatment has been planned according to the most current medical practices available, unavoidable complications occasionally occur. If you have any problems or questions after discharge, call DR. Aniston Christman, [phone].  ACTIVITY  You may resume your regular activity, but move at a slower pace for the next 24 hours.   Take frequent rest periods for the next 24 hours.   Walking will help get rid of the air and reduce the bloated feeling in your belly (abdomen).   No driving for 24 hours (because of the medicine (anesthesia) used during the test).   You may shower.   Do not sign any important legal documents or operate any machinery for 24 hours (because of the anesthesia used during the test).    NUTRITION  Drink plenty of fluids.   You may resume your normal diet as instructed by your doctor.   Begin with a light meal and progress to your normal diet. Heavy or fried foods are harder to digest and may make you feel sick to your stomach (nauseated).   Avoid alcoholic beverages for 24 hours or as instructed.    MEDICATIONS  You may resume your normal medications.   WHAT YOU CAN EXPECT TODAY  Some feelings of bloating in the abdomen.   Passage of more gas than usual.   Spotting of blood in your stool or on the toilet paper  .  IF  YOU HAD POLYPS REMOVED DURING THE COLONOSCOPY:  Eat a soft diet IF YOU HAVE NAUSEA, BLOATING, ABDOMINAL PAIN, OR VOMITING.    FINDING OUT THE RESULTS OF YOUR TEST Not all test results are available during your visit. DR. Oneida Alar WILL CALL YOU WITHIN 7 DAYS OF YOUR PROCEDUE WITH YOUR RESULTS. Do not assume everything is normal if you have not heard from DR. Trent Gabler IN ONE WEEK, CALL HER OFFICE AT [phone].  SEEK IMMEDIATE MEDICAL ATTENTION AND CALL THE OFFICE: [phone] IF:  You have more than a spotting of blood in your stool.   Your belly is swollen (abdominal distention).   You are nauseated or vomiting.   You have a temperature over 101F.   You have abdominal pain or discomfort that is severe or gets worse throughout the day.   Low-Fat Diet BREADS, CEREALS, PASTA, RICE, DRIED PEAS, AND BEANS These products are high in carbohydrates and most are low in fat. Therefore, they can be increased in the diet as substitutes for fatty foods. They too, however, contain calories and should not be eaten in excess. Cereals can be eaten for snacks as well as for breakfast.   FRUITS AND VEGETABLES It is good to eat fruits and vegetables. Besides being sources of fiber, both are rich in vitamins and some minerals. They help you get the daily allowances of these nutrients. Fruits and  vegetables can be used for snacks and desserts.  MEATS Limit lean meat, chicken, [place], and fish to no more than 6 ounces per day. Beef, Pork, and Lamb Use lean cuts of beef, pork, and lamb. Lean cuts include:  Extra-lean ground beef.  Arm roast.  Sirloin tip.  Center-cut ham.  Round steak.  Loin chops.  Rump roast.  Tenderloin.  Trim all fat off the outside of meats before cooking. It is not necessary to severely decrease the intake of red meat, but lean choices should be made. Lean meat is rich in protein and contains a highly absorbable form of iron. Premenopausal women, in particular, should avoid  reducing lean red meat because this could increase the risk for low red blood cells (iron-deficiency anemia).  Chicken and [place] These are good sources of protein. The fat of poultry can be reduced by removing the skin and underlying fat layers before cooking. Chicken and [place] can be substituted for lean red meat in the diet. Poultry should not be fried or covered with high-fat sauces. Fish and Shellfish Fish is a good source of protein. Shellfish contain cholesterol, but they usually are low in saturated fatty acids. The preparation of fish is important. Like chicken and [place], they should not be fried or covered with high-fat sauces. EGGS Egg whites contain no fat or cholesterol. They can be eaten often. Try 1 to 2 egg whites instead of whole eggs in recipes or use egg substitutes that do not contain yolk. MILK AND DAIRY PRODUCTS Use skim or 1% milk instead of 2% or whole milk. Decrease whole milk, natural, and processed cheeses. Use nonfat or low-fat (2%) cottage cheese or low-fat cheeses made from vegetable oils. Choose nonfat or low-fat (1 to 2%) yogurt. Experiment with evaporated skim milk in recipes that call for heavy cream. Substitute low-fat yogurt or low-fat cottage cheese for sour cream in dips and salad dressings. Have at least 2 servings of low-fat dairy products, such as 2 glasses of skim (or 1%) milk each day to help get your daily calcium intake. FATS AND OILS Reduce the total intake of fats, especially saturated fat. Butterfat, lard, and beef fats are high in saturated fat and cholesterol. These should be avoided as much as possible. Vegetable fats do not contain cholesterol, but certain vegetable fats, such as coconut oil, palm oil, and palm kernel oil are very high in saturated fats. These should be limited. These fats are often used in bakery goods, processed foods, popcorn, oils, and nondairy creamers. Vegetable shortenings and some peanut butters contain hydrogenated oils, which  are also saturated fats. Read the labels on these foods and check for saturated vegetable oils. Unsaturated vegetable oils and fats do not raise blood cholesterol. However, they should be limited because they are fats and are high in calories. Total fat should still be limited to 30% of your daily caloric intake. Desirable liquid vegetable oils are corn oil, cottonseed oil, olive oil, canola oil, safflower oil, soybean oil, and sunflower oil. Peanut oil is not as good, but small amounts are acceptable. Buy a heart-healthy tub margarine that has no partially hydrogenated oils in the ingredients. Mayonnaise and salad dressings often are made from unsaturated fats, but they should also be limited because of their high calorie and fat content. Seeds, nuts, peanut butter, olives, and avocados are high in fat, but the fat is mainly the unsaturated type. These foods should be limited mainly to avoid excess calories and fat. OTHER EATING TIPS Snacks  Most sweets should be limited as snacks. They tend to be rich in calories and fats, and their caloric content outweighs their nutritional value. Some good choices in snacks are graham crackers, melba toast, soda crackers, bagels (no egg), English muffins, fruits, and vegetables. These snacks are preferable to snack crackers, [place] fries, TORTILLA CHIPS, and POTATO chips. Popcorn should be air-popped or cooked in small amounts of liquid vegetable oil. Desserts Eat fruit, low-fat yogurt, and fruit ices instead of pastries, cake, and cookies. Sherbet, angel food cake, gelatin dessert, frozen low-fat yogurt, or other frozen products that do not contain saturated fat (pure fruit juice bars, frozen ice pops) are also acceptable.  COOKING METHODS Choose those methods that use little or no fat. They include: Poaching.  Braising.  Steaming.  Grilling.  Baking.  Stir-frying.  Broiling.  Microwaving.  Foods can be cooked in a nonstick pan without added fat, or use a  nonfat cooking spray in regular cookware. Limit fried foods and avoid frying in saturated fat. Add moisture to lean meats by using water, broth, cooking wines, and other nonfat or low-fat sauces along with the cooking methods mentioned above. Soups and stews should be chilled after cooking. The fat that forms on top after a few hours in the refrigerator should be skimmed off. When preparing meals, avoid using excess salt. Salt can contribute to raising blood pressure in some people.  EATING AWAY FROM HOME Order entres, potatoes, and vegetables without sauces or butter. When meat exceeds the size of a deck of cards (3 to 4 ounces), the rest can be taken home for another meal. Choose vegetable or fruit salads and ask for low-calorie salad dressings to be served on the side. Use dressings sparingly. Limit high-fat toppings, such as bacon, crumbled eggs, cheese, sunflower seeds, and olives. Ask for heart-healthy tub margarine instead of butter.

## 2018-12-31 NOTE — Anesthesia Preprocedure Evaluation (Signed)
Anesthesia Evaluation  Patient identified by MRN, date of birth, ID band Patient awake    Reviewed: Allergy & Precautions, NPO status , Patient's Chart, lab work & pertinent test results  Airway Mallampati: II  TM Distance: >3 FB Neck ROM: Full    Dental no notable dental hx. (+) Teeth Intact   Pulmonary asthma ,    Pulmonary exam normal breath sounds clear to auscultation       Cardiovascular Exercise Tolerance: Good negative cardio ROS Normal cardiovascular examI Rhythm:Regular Rate:Normal     Neuro/Psych negative neurological ROS  negative psych ROS   GI/Hepatic negative GI ROS, Neg liver ROS,   Endo/Other  Morbid obesityBMI>40 Denies known OSA  Renal/GU negative Renal ROS  negative genitourinary   Musculoskeletal negative musculoskeletal ROS (+)   Abdominal   Peds negative pediatric ROS (+)  Hematology negative hematology ROS (+)   Anesthesia Other Findings   Reproductive/Obstetrics negative OB ROS                             Anesthesia Physical Anesthesia Plan  ASA: III  Anesthesia Plan: General   Post-op Pain Management:    Induction: Intravenous  PONV Risk Score and Plan: 2 and Propofol infusion, TIVA and Treatment may vary due to age or medical condition  Airway Management Planned: Nasal Cannula and Simple Face Mask  Additional Equipment:   Intra-op Plan:   Post-operative Plan:   Informed Consent: I have reviewed the patients History and Physical, chart, labs and discussed the procedure including the risks, benefits and alternatives for the proposed anesthesia with the patient or authorized representative who has indicated his/her understanding and acceptance.     Dental advisory given  Plan Discussed with: CRNA  Anesthesia Plan Comments: (Plan Full PPE use  Plan GA with GETA as needed d/w pt -WTP with same after Q&A)        Anesthesia Quick  Evaluation

## 2019-01-01 LAB — SURGICAL PATHOLOGY

## 2019-01-04 ENCOUNTER — Encounter (HOSPITAL_COMMUNITY): Payer: Self-pay | Admitting: Gastroenterology

## 2019-01-07 ENCOUNTER — Telehealth: Payer: Self-pay | Admitting: Gastroenterology

## 2019-01-07 NOTE — Telephone Encounter (Signed)
Pt called for his procedure results. [phone]

## 2019-01-07 NOTE — Telephone Encounter (Signed)
Forwarding to Dr.Fields.  

## 2019-01-08 ENCOUNTER — Telehealth: Payer: Self-pay | Admitting: Gastroenterology

## 2019-01-08 NOTE — Telephone Encounter (Signed)
PATIENT CALLED WANTING TO KNOW HIS RESULTS FROM HIS PROCEDURE

## 2019-01-09 NOTE — Telephone Encounter (Signed)
SEE TC OCT 28.

## 2019-01-09 NOTE — Telephone Encounter (Signed)
Forwarding to Dr. Fields for results.  

## 2019-01-12 NOTE — Telephone Encounter (Addendum)
Called patient TO DISCUSS RESULTS. EXPLAINED TCS RESULTS. TRIED IMODIUM SLOWED DOWN BUT NOT IDEALLY CONTROLLED. PT SHOULD FOLLOW A DAIRY FREE/LOW FAT DIET. START BENTYL 10 MGQACHS.   CALL IN ONE MONTH IF SYMPTOMS ARE NOT IMPROVED.  OPV IN FEB 2021, Dx: DIARRHEA.  SEND HANDOUT ON LOW FAT AND LACTOSE FREE DIET.

## 2019-01-14 NOTE — Telephone Encounter (Signed)
Low fat and Lactose free handouts mailed.

## 2019-01-14 NOTE — Telephone Encounter (Signed)
PATIENT SCHEDULED  °

## 2019-02-25 ENCOUNTER — Telehealth: Payer: Self-pay | Admitting: Gastroenterology

## 2019-02-25 DIAGNOSIS — K625 Hemorrhage of anus and rectum: Secondary | ICD-10-CM

## 2019-02-25 NOTE — Telephone Encounter (Signed)
Pt returned call. Pt had his TCS 12/2018 and is still having concerns of bleeding, feeling tired daily and having water diarrhea at least 6 times daily. Pt is noticing a teaspoon amount of blood in the toilet each time he has a bowel movement. Pt also mentions some pain at his rectum. Pt mentioned that he had a family friend physician look at his chart and the pt would like to know what the report is saying is abnormal on his report. Please advise.   Routing to AB in the Absence of EG and SLF.

## 2019-02-25 NOTE — Telephone Encounter (Signed)
PATIENT CALLED AND L/M THAT HE IS STILL HAVING SOME RECTAL BLEEDING AND DIARRHEA SINCE HE HAD HIS PROCEDURE     PLEASE GIVE HIM A CALL

## 2019-02-25 NOTE — Telephone Encounter (Signed)
Lmom, waiting on a return call.  

## 2019-02-26 ENCOUNTER — Other Ambulatory Visit: Payer: Self-pay

## 2019-02-26 DIAGNOSIS — K625 Hemorrhage of anus and rectum: Secondary | ICD-10-CM

## 2019-02-26 NOTE — Telephone Encounter (Signed)
I will put in for a STAT CBC due to concerns of ongoing rectal bleeding. Have this checked as soon as possible.  I will defer to Dr. Oneida Alar who is back tomorrow about the results (not sure based on the phone note what was discussed, as far as his concern that something wasn't told to him).

## 2019-02-26 NOTE — Telephone Encounter (Signed)
Please address with Randall Hiss today.

## 2019-02-26 NOTE — Telephone Encounter (Signed)
Noted Spoke with pt. Pt will have his labs drawn at [facility] lab tomorrow 02/27/2019.

## 2019-02-26 NOTE — Addendum Note (Signed)
Addended by: Gordy Levan, Agnes Probert A on: 02/26/2019 04:37 PM   Modules accepted: Orders

## 2019-02-27 LAB — CBC WITH DIFFERENTIAL/PLATELET
Absolute Monocytes: 475 cells/uL (ref 200–950)
Basophils Absolute: 33 cells/uL (ref 0–200)
Basophils Relative: 0.5 %
Eosinophils Absolute: 143 cells/uL (ref 15–500)
Eosinophils Relative: 2.2 %
HCT: 45.9 % (ref 38.5–50.0)
Hemoglobin: 15.5 g/dL (ref 13.2–17.1)
Lymphs Abs: 1918 cells/uL (ref 850–3900)
MCH: 29.4 pg (ref 27.0–33.0)
MCHC: 33.8 g/dL (ref 32.0–36.0)
MCV: 87.1 fL (ref 80.0–100.0)
MPV: 9.3 fL (ref 7.5–12.5)
Monocytes Relative: 7.3 %
Neutro Abs: 3933 cells/uL (ref 1500–7800)
Neutrophils Relative %: 60.5 %
Platelets: 335 10*3/uL (ref 140–400)
RBC: 5.27 10*6/uL (ref 4.20–5.80)
RDW: 12.5 % (ref 11.0–15.0)
Total Lymphocyte: 29.5 %
WBC: 6.5 10*3/uL (ref 3.8–10.8)

## 2019-02-28 ENCOUNTER — Encounter: Payer: Self-pay | Admitting: Gastroenterology

## 2019-02-28 ENCOUNTER — Ambulatory Visit: Payer: PRIVATE HEALTH INSURANCE | Admitting: Nurse Practitioner

## 2019-02-28 NOTE — Addendum Note (Signed)
Addended by: Cheron Every on: 02/28/2019 08:53 AM   Modules accepted: Orders

## 2019-02-28 NOTE — Telephone Encounter (Signed)
Noted  

## 2019-02-28 NOTE — Telephone Encounter (Signed)
Called spoke with patient. He is aware of results below. Referral sent to surgery. Erline Levine please schedule appt in feb 2021 thanks

## 2019-02-28 NOTE — Telephone Encounter (Signed)
SCHEDULED AND LETTER SENT  °

## 2019-02-28 NOTE — Telephone Encounter (Signed)
PLEASE CALL PT. HIS BLOOD COUNT IS NORMAL. WE WILL REFER HIM TO SURGERY FOR ANOSCOPY/CONSIDER HEMORRHOIDECTOMY, DX: RECTAL BLEEDING. HIS DIARRHEA IS DUE TO EATING A HIGH FAT DIET AND DAIRY.   FOLLOW A LOW FAT AND LACTOSE FREE DIET. TAKE BENTYL 10 MGQACHS.   OPV IN FEB 2021, Dx: DIARRHEA.

## 2019-03-11 ENCOUNTER — Telehealth: Payer: Self-pay | Admitting: Gastroenterology

## 2019-03-11 NOTE — Telephone Encounter (Signed)
I spoke with Devin Smith and we reviewed the chart and made the patient aware to keep his appt with Dr. Arnoldo Morale tomorrow and we will see him in February.

## 2019-03-11 NOTE — Telephone Encounter (Signed)
Pt was referred to [place] from [place] and had OV with [place] for rectal bleeding. SF referred him to see Dr Arnoldo Morale and has appointment to see him tomorrow. Patient said his PCP told him to follow up with [place] regarding his back pain above his rectum. He has OV with [place] in FEB. Please advise if we need to see him for back pain. [phone]

## 2019-03-12 ENCOUNTER — Other Ambulatory Visit: Payer: Self-pay

## 2019-03-12 ENCOUNTER — Encounter: Payer: Self-pay | Admitting: General Surgery

## 2019-03-12 ENCOUNTER — Ambulatory Visit (INDEPENDENT_AMBULATORY_CARE_PROVIDER_SITE_OTHER): Payer: PRIVATE HEALTH INSURANCE | Admitting: General Surgery

## 2019-03-12 VITALS — BP 123/77 | HR 99 | Temp 99.1°F | Resp 16 | Ht 75.0 in | Wt 334.0 lb

## 2019-03-12 DIAGNOSIS — K625 Hemorrhage of anus and rectum: Secondary | ICD-10-CM

## 2019-03-12 MED ORDER — CHOLESTYRAMINE 4 G PO PACK: 4.0000 g | PACK | Freq: Three times a day (TID) | ORAL | 12 refills | Status: DC

## 2019-03-12 NOTE — Telephone Encounter (Signed)
Noted  

## 2019-03-12 NOTE — Progress Notes (Signed)
Devin Smith; [id number]; June 26, 1995   HPI Patient is a 23 year old white male who was referred to my care by Aggie Hacker and Dr. Trinda Pascal for evaluation treatment of rectal pain and blood per rectum.  Patient has had intermittent rectal bleeding for over a year.  He states he notices bright red and pink blood in the toilet paper after moving his bowels.  He does have a family history of colon cancer in both a brother and a father.  He did have a colonoscopy by Dr. Oneida Alar in October of this year.  He is being worked up for a familial malignant disorder.  He also had a laparoscopic cholecystectomy earlier this year at another facility and has had ongoing diarrhea since that time.  He sometimes goes 10-15 times a day.  He states this makes his rectal pain and bleeding worse.  The pain will radiate to his tailbone.  He has not tried any creams or suppositories.  He has been put on a medication that was possibly Lomotil or Imodium A-D.  He states his pain is 10 out of 10 when he does have bowel movements. Past Medical History:  Diagnosis Date  . Cardiomegaly   . Chest wall pain, chronic   . Pectus carinatum     Past Surgical History:  Procedure Laterality Date  . BIOPSY  12/31/2018   Procedure: BIOPSY;  Surgeon: Danie Binder, MD;  Location: [facility];  Service: Endoscopy;;  Ileal, Right , left colon biopsies   . 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 COLONOSCOPY WITH PROPOFOL N/A 12/31/2018   Procedure: COLONOSCOPY WITH PROPOFOL;  Surgeon: Danie Binder, MD;  Location: [facility];  Service: Endoscopy;  Laterality: N/A;  10:15am  . [place] for pecuts carinatum on 10/14/2011      Family History  Problem Relation Age of Onset  . Hypertension Mother   . Diabetes Mother   . Kidney cancer Mother   . Hyperlipidemia Mother   . Stomach cancer Father   . Brain cancer Father   . Hypertension Father   . Hyperlipidemia Father   . Colon cancer Father 74  . Thyroid  disease Sister   . Colon cancer Brother 105       ? surgical treatment  . Prostate cancer Brother   . Heart failure Maternal Grandmother   . Heart disease Maternal Grandmother   . Heart disease Paternal Grandmother   . Heart failure Paternal Grandmother     Current Outpatient Medications on File Prior to Visit  Medication Sig Dispense Refill  . acetaminophen (TYLENOL) 500 MG tablet Take 1,000 mg by mouth every 6 (six) hours as needed for moderate pain or headache.    . albuterol (VENTOLIN HFA) 108 (90 Base) MCG/ACT inhaler Inhale 2 puffs into the lungs every 6 (six) hours as needed for wheezing or shortness of breath.    . calcium carbonate (TUMS) 500 MG chewable tablet 1 po with meals TID 90 tablet 11  . doxycycline (VIBRAMYCIN) 100 MG capsule Take 100 mg by mouth 2 (two) times daily.    Marland Kitchen ibuprofen (ADVIL) 200 MG tablet Take 600 mg by mouth every 6 (six) hours as needed for headache or moderate pain.     No current facility-administered medications on file prior to visit.    No Known Allergies  Social History   Substance and Sexual Activity  Alcohol Use No    Social History   Tobacco Use  Smoking Status  Never Smoker  Smokeless Tobacco Never Used    Review of Systems  Constitutional: Positive for malaise/fatigue.  HENT: Negative.   Eyes: Negative.   Respiratory: Negative.   Cardiovascular: Negative.   Gastrointestinal: Negative.   Genitourinary: Negative.   Musculoskeletal: Positive for back pain.  Skin: Negative.   Neurological: Negative.   Endo/Heme/Allergies: Negative.   Psychiatric/Behavioral: Negative.     Objective   Vitals:   03/12/19 1530  BP: 123/77  Pulse: 99  Resp: 16  Temp: 99.1 F (37.3 C)  SpO2: 96%    Physical Exam Vitals reviewed.  Constitutional:      Appearance: Normal appearance. He is obese. He is not ill-appearing.  HENT:     Head: Normocephalic and atraumatic.  Cardiovascular:     Rate and Rhythm: Normal rate and regular  rhythm.     Heart sounds: Normal heart sounds. No murmur. No friction rub. No gallop.   Pulmonary:     Effort: Pulmonary effort is normal. No respiratory distress.     Breath sounds: Normal breath sounds. No stridor. No wheezing, rhonchi or rales.  Abdominal:     General: Bowel sounds are normal. There is no distension.     Palpations: Abdomen is soft. There is no mass.     Tenderness: There is no abdominal tenderness. There is no guarding or rebound.     Hernia: No hernia is present.  Genitourinary:    Comments: Digital rectal examination revealed no external hemorrhoids present.  Mild perianal irritation of the skin was noted.  He did have pain along the posterior aspect of the anal verge with possibly a small healing anal fissure present.  No blood was noted.  It was difficult to fully a certain any internal hemorrhoids, though none prolapsed out.  On pressing on the posterior external sphincter, this did reproduce his pain to his tailbone. Skin:    General: Skin is warm and dry.  Neurological:     Mental Status: He is alert and oriented to person, place, and time.     Assessment  Patient seems to have multiple issues going on at the present time.  No external hemorrhoids are present at this time.  He may have a healing posterior anal fissure that is aggravated by his post cholecystectomy diarrhea.  Genetic work-up is pending. Plan   I have given him samples of Rectiv to help with the anal pain.  I am also starting him on cholestyramine in hopes of getting his diarrhea under control as this started after his cholecystectomy.  I will see him again in 1 month to reassess.  All his questions were answered.

## 2019-03-12 NOTE — Patient Instructions (Signed)
Anal Fissure, Adult  An anal fissure is a small tear or crack in the tissue of the anus. Bleeding from a fissure usually stops on its own within a few minutes. However, bleeding will often occur again with each bowel movement until the fissure heals. What are the causes? This condition is usually caused by passing a large or hard stool (feces). Other causes include:  Constipation.  Frequent diarrhea.  Inflammatory bowel disease (Crohn's disease or ulcerative colitis).  Childbirth.  Infections.  Anal sex. What are the signs or symptoms? Symptoms of this condition include:  Bleeding from the rectum.  Small amounts of blood seen on your stool, on the toilet paper, or in the toilet after a bowel movement. The blood coats the outside of the stool and is not mixed with the stool.  Painful bowel movements.  Itching or irritation around the anus. How is this diagnosed? A health care provider may diagnose this condition by closely examining the anal area. An anal fissure can usually be seen with careful inspection. In some cases, a rectal exam may be performed, or a short tube (anoscope) may be used to examine the anal canal. How is this treated? Initial treatment for this condition may include:  Taking steps to avoid constipation. This may include making changes to your diet, such as increasing your intake of fiber or fluid.  Taking fiber supplements. These supplements can soften your stool to help make bowel movements easier. Your health care provider may also prescribe a stool softener if your stool is hard.  Taking sitz baths. This may help to heal the tear.  Using medicated creams or ointments. These may be prescribed to lessen discomfort. Treatments that are sometimes used if initial treatments do not work well or if the condition is more severe may include:  Botulinum injection.  Surgery to repair the fissure. Follow these instructions at home: Eating and drinking    Avoid foods that may cause constipation, such as bananas, milk, and other dairy products.  Eat all fruits, except bananas.  Drink enough fluid to keep your urine pale yellow.  Eat foods that are high in fiber, such as beans, whole grains, and fresh fruits and vegetables. General instructions   Take over-the-counter and prescription medicines only as told by your health care provider.  Use creams or ointments only as told by your health care provider.  Keep the anal area clean and dry.  Take sitz baths as told by your health care provider. Do not use soap in the sitz baths.  Keep all follow-up visits as told by your health care provider. This is important. Contact a health care provider if you have:  More bleeding.  A fever.  Diarrhea that is mixed with blood.  Pain that continues.  Ongoing problems that are getting worse rather than better. Summary  An anal fissure is a small tear or crack in the tissue of the anus. This condition is usually caused by passing a large or hard stool (feces). Other causes include constipation and frequent diarrhea.  Initial treatment for this condition may include taking steps to avoid constipation, such as increasing your intake of fiber or fluid.  Follow instructions for care as told by your health care provider.  Contact your health care provider if you have more bleeding or your problem is getting worse rather than better.  Keep all follow-up visits as told by your health care provider. This is important. This information is not intended to replace advice given   to you by your health care provider. Make sure you discuss any questions you have with your health care provider. Document Released: 02/28/2005 Document Revised: 08/10/2017 Document Reviewed: 08/10/2017 Elsevier Patient Education  2020 Elsevier Inc. Cholestyramine powder for oral suspension What is this medicine? CHOLESTYRAMINE (koe LESS tir a meen) is used to lower cholesterol  in patients who are at risk of heart disease or stroke. This medicine is only for patients whose cholesterol level is not controlled by diet. This medicine may be used for other purposes; ask your health care provider or pharmacist if you have questions. COMMON BRAND NAME(S): Locholest, Locholest Light, Prevalite, Questran, Questran Light What should I tell my health care provider before I take this medicine? They need to know if you have any of these conditions:  blocked bile duct  an unusual or allergic reaction to cholestyramine, other medicines, foods, dyes, or preservatives  pregnant or trying to get pregnant  breast-feeding How should I use this medicine? Do not take this medicine in the dry form. It must be mixed with a liquid before swallowing. Follow the directions on the prescription label. Place the powder in a glass or cup. Add between 2 and 6 ounces of fluid. This can be water, milk, pulpy fruit juice, fluid soup, or other liquid. Mix well and drink all of the liquid. Take your doses at regular intervals. Do not take your medicine more often than directed. Talk to your pediatrician regarding the use of this medicine in children. Special care may be needed. Overdosage: If you think you have taken too much of this medicine contact a poison control center or emergency room at once. NOTE: This medicine is only for you. Do not share this medicine with others. What if I miss a dose? If you miss a dose, take it as soon as you can. If it is almost time for your next dose, take only that dose. Do not take double or extra doses. What may interact with this medicine?  diuretics  male hormones, like estrogens or progestins and birth control pills  heart medicines such as digoxin or digitoxin  penicillin G  phenobarbital  phenylbutazone  phytonadione  propranolol  tetracycline antibiotics  thyroid hormones  vitamin A  vitamin D  vitamin E  warfarin Take other drugs  at least 1 hour before or 4 hours after this medicine, to avoid decreasing their absorption. This list may not describe all possible interactions. Give your health care provider a list of all the medicines, herbs, non-prescription drugs, or dietary supplements you use. Also tell them if you smoke, drink alcohol, or use illegal drugs. Some items may interact with your medicine. What should I watch for while using this medicine? Visit your doctor or health care professional for regular checks on your progress. Your blood fats and other tests will be measured from time to time. This medicine is only part of a total cholesterol-lowering program. Your health care professional or dietician can suggest a low-cholesterol and low-fat diet that will reduce your risk of getting heart and blood vessel disease. Avoid alcohol and smoking, and keep a proper exercise schedule. To reduce the chance of getting constipated, drink plenty of water and increase the amount of fiber in your diet. Ask your doctor or health care professional for advice if you are constipated. This medicine may cause a decrease in folic acid. You should make sure that you get enough folic acid while you are taking this medicine. Discuss the foods you eat  and the vitamins you take with your health care professional. What side effects may I notice from receiving this medicine? Side effects that you should report to your doctor or health care professional as soon as possible:  allergic reactions like skin rash, itching or hives, swelling of the face, lips, or tongue  bloody or black, tarry stools  severe stomach pain with nausea and vomiting  unusual bleeding Side effects that usually do not require medical attention (report to your doctor or health care professional if they continue or are bothersome):  constipation or diarrhea  dizziness  headache  heartburn, indigestion  nausea, vomiting  perianal irritation This list may not  describe all possible side effects. Call your doctor for medical advice about side effects. You may report side effects to FDA at 1-800-FDA-1088. Where should I keep my medicine? Keep out of the reach of children. Store at room temperature between 15 and 30 degrees C (59 and 86 degrees F). Throw away any unused medicine after the expiration date. NOTE: This sheet is a summary. It may not cover all possible information. If you have questions about this medicine, talk to your doctor, pharmacist, or health care provider.  2020 Elsevier/Gold Standard (2016-10-18 11:50:51)

## 2019-03-19 ENCOUNTER — Telehealth: Payer: Self-pay | Admitting: Gastroenterology

## 2019-03-19 NOTE — Telephone Encounter (Signed)
This pt is being referred for Rectal bleeding, RUQ pain and epigastric pain from [facility].  He was sen at [facility] 12/2018 records are in [place].  Pt wants to transfer here because he is very unhappy with and states they lied to him.  Referral received 03/18/2019 and you are DOD.  Please advise, Thank you

## 2019-03-19 NOTE — Telephone Encounter (Signed)
He can be seen in the office, can be booked for a routine visit with [place]. Thanks

## 2019-03-20 ENCOUNTER — Encounter: Payer: Self-pay | Admitting: Gastroenterology

## 2019-03-20 NOTE — Telephone Encounter (Signed)
Left message for pt to cal back to schedule OV with Dr Adela Lank.

## 2019-03-21 ENCOUNTER — Ambulatory Visit: Payer: PRIVATE HEALTH INSURANCE | Admitting: Nurse Practitioner

## 2019-03-23 ENCOUNTER — Encounter: Payer: Self-pay | Admitting: General Surgery

## 2019-04-09 ENCOUNTER — Ambulatory Visit (INDEPENDENT_AMBULATORY_CARE_PROVIDER_SITE_OTHER): Payer: PRIVATE HEALTH INSURANCE | Admitting: General Surgery

## 2019-04-09 ENCOUNTER — Encounter: Payer: Self-pay | Admitting: General Surgery

## 2019-04-09 ENCOUNTER — Other Ambulatory Visit: Payer: Self-pay

## 2019-04-09 VITALS — BP 136/88 | HR 85 | Temp 97.7°F | Resp 16 | Ht 75.0 in | Wt 333.0 lb

## 2019-04-09 DIAGNOSIS — R197 Diarrhea, unspecified: Secondary | ICD-10-CM | POA: Diagnosis not present

## 2019-04-09 DIAGNOSIS — Z9049 Acquired absence of other specified parts of digestive tract: Secondary | ICD-10-CM

## 2019-04-09 NOTE — Progress Notes (Signed)
Subjective:     Devin Smith  Patient here for follow-up of rectal bleeding and postcholecystectomy diarrhea.  He states the Questran has significantly helped his diarrhea and he is now only having 1 bowel movement a day.  He has decreased his Questran dosing to once a day.  He states the blood per rectum has now become just pinkish mucousy fluid.  His rectal pain has resolved. Objective:    BP 136/88 (BP Location: Left Arm, Patient Position: Sitting, Cuff Size: Large)   Pulse 85   Temp 97.7 F (36.5 C) (Oral)   Resp 16   Ht 6\' 3"  (1.905 m)   Wt (!) 333 lb (151 kg)   SpO2 98%   BMI 41.62 kg/m   General:  alert, cooperative and no distress       Assessment:    Patient had postcholecystectomy diarrhea which has resolved with Questran.  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  He states he is scheduled to have genetic testing done for a family history of colon malignancy next month.  No need for surgical intervention at this time.    Plan:   I will see the patient again in 1 month for follow-up to see whether or not the rectal discharge has resolved.

## 2019-04-19 ENCOUNTER — Telehealth: Payer: Self-pay | Admitting: Gastroenterology

## 2019-04-19 NOTE — Telephone Encounter (Signed)
Left message to please call back. °

## 2019-04-22 ENCOUNTER — Ambulatory Visit: Payer: PRIVATE HEALTH INSURANCE | Admitting: Gastroenterology

## 2019-05-01 ENCOUNTER — Telehealth: Payer: Self-pay | Admitting: Nurse Practitioner

## 2019-05-01 ENCOUNTER — Encounter: Payer: Self-pay | Admitting: Nurse Practitioner

## 2019-05-01 ENCOUNTER — Ambulatory Visit (INDEPENDENT_AMBULATORY_CARE_PROVIDER_SITE_OTHER): Payer: PRIVATE HEALTH INSURANCE | Admitting: Nurse Practitioner

## 2019-05-01 ENCOUNTER — Other Ambulatory Visit: Payer: Self-pay

## 2019-05-01 VITALS — BP 128/69 | HR 73 | Temp 96.9°F | Ht 75.0 in | Wt 333.4 lb

## 2019-05-01 DIAGNOSIS — R197 Diarrhea, unspecified: Secondary | ICD-10-CM

## 2019-05-01 DIAGNOSIS — K591 Functional diarrhea: Secondary | ICD-10-CM | POA: Diagnosis not present

## 2019-05-01 DIAGNOSIS — K625 Hemorrhage of anus and rectum: Secondary | ICD-10-CM | POA: Diagnosis not present

## 2019-05-01 DIAGNOSIS — Z809 Family history of malignant neoplasm, unspecified: Secondary | ICD-10-CM

## 2019-05-01 DIAGNOSIS — Z800 Family history of malignant neoplasm of digestive organs: Secondary | ICD-10-CM | POA: Diagnosis not present

## 2019-05-01 HISTORY — DX: Diarrhea, unspecified: R19.7

## 2019-05-01 MED ORDER — DIPHENOXYLATE-ATROPINE 2.5-0.025 MG PO TABS: 1.0000 | ORAL_TABLET | Freq: Four times a day (QID) | ORAL | 3 refills | Status: DC | PRN

## 2019-05-01 NOTE — Progress Notes (Signed)
Spoke with Dr. Darrick Penna. No specific reccommendations on specific genetic tests. Refer to genetic counseling for significant family history of CRC.

## 2019-05-01 NOTE — Addendum Note (Signed)
Addended by: Corrie Mckusick on: 05/01/2019 05:20 PM   Modules accepted: Orders

## 2019-05-01 NOTE — Assessment & Plan Note (Signed)
Significant family history of colon cancer.  At some point it was recommended to you possible genetic testing.  I cannot find the specifics related to this recommendation.  He would likely benefit from referral to genetic counseling and genetic testing related to significant family history of colon cancer.  If this is what was requested we will proceed at this time.  Further recommendations to follow.  Follow-up in 2 months.

## 2019-05-01 NOTE — Progress Notes (Addendum)
REVIEWED-NO ADDITIONAL RECOMMENDATIONS.  Referring Provider: [facility], [facility] Primary Care Physician:  Lovey Newcomer, PA Primary GI:  Dr. Darrick Penna  Chief Complaint  Patient presents with  . Rectal Bleeding    diarrhea better    HPI:   Devin Smith is a 24 y.o. male who presents for follow-up of diarrhea.  The patient was last seen in our office 12/19/2018 for rectal bleeding and family history of colon cancer.  When we first evaluated him he had noted 7-day history of bleeding.  Family history of colon cancer in father and brother at age 33 and 47 respectively.  CBC was essentially normal.  At his last visit he noted rectal bleeding in the commode with variable frequency that seems to have slowed as of late.  Lower sharp abdominal pain with a bowel movement that improves afterward and if he eats anything it "goes right through me" that started after cholecystectomy.  No other overt GI complaints.  Previously seen GI in [place], [place] when he was having gallbladder issues.  Status post cholecystectomy.  Recommended colonoscopy, follow-up in 3 months.  Diarrhea possibly due to bile salt diarrhea status post cholecystectomy.  Ostomy was completed 12/31/2018 which found normal ileum status post biopsy, tortuous colon status post biopsy, no obvious source of rectal bleeding identified.  Surgical pathology found the ileal biopsies to be focal active ileitis, right colon biopsies unremarkable, left colon biopsies unremarkable, rectal biopsies unremarkable.  Differentials for the ileitis include backwash ileitis as well as infection.  When he was called his results he explained Imodium to slow down but did not generally control his diarrhea.  Recommended a dairy free/low-fat diet, start Bentyl and if no improvement call in 1 month.  He called our office again on 02/25/2019 indicating persistent diarrhea at least 6 times a day with about a teaspoon of blood in the toilet each time and  rectal pain.  Recommended stat CBC (was into normal).  He was referred to surgery for anoscopy and consider hemorrhoidectomy.  Surgery saw the patient 03/12/2019 and recommended Rectiv for anal pain and possible healing posterior anal fissure aggravated by postcholecystectomy diarrhea.  Also started on cholestyramine and follow-up in 1 month.  Notes indicate the patient is attempting to transfer his care to [facility].  Today he states he's doing ok overall. Has been using Rectiv and still having rectal bleeding. Diarrhea is somewhat improved and now intermittent ("diarrhea is a whole lot better than it was a year ago). Was supposed to see another GI tomorrow because he's here already. His family doctor wants a second opinion. Has rectal bleeding at least every other bowel movement. Has a bowel movement after every oral intake, at least half are diarrhea. Is still on cholestyramine. Has intermittent abdominal pain in his mid-abdomen, typically resolves with a bowel movement. Denies melena, N/V, fever, chills, unintentional weight loss. Denies URI or flu-like symptoms. Denies loss of sense of taste or smell. Was tested for COVID-19 priot to TCS which was negatinve; no testing since then. Denies chest pain, dyspnea, dizziness, lightheadedness, syncope, near syncope. Denies any other upper or lower GI symptoms.  Further discussion reveals "sharp" rectal pain when he has bleeding. Query possible fissure.  Past Medical History:  Diagnosis Date  . Cardiomegaly   . Chest wall pain, chronic   . Pectus carinatum     Past Surgical History:  Procedure Laterality Date  . BIOPSY  12/31/2018   Procedure: BIOPSY;  Surgeon: West Bali, MD;  Location: [facility];  Service: Endoscopy;;  Ileal, Right , left colon biopsies   . 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 COLONOSCOPY WITH PROPOFOL N/A 12/31/2018   Procedure: COLONOSCOPY WITH PROPOFOL;  Surgeon: West Bali, MD;  Location:  [facility];  Service: Endoscopy;  Laterality: N/A;  10:15am  . [place] for pecuts carinatum on 10/14/2011      Current Outpatient Medications  Medication Sig Dispense Refill  . acetaminophen (TYLENOL) 500 MG tablet Take 1,000 mg by mouth every 6 (six) hours as needed for moderate pain or headache.    . cholestyramine (QUESTRAN) 4 g packet Take 1 packet (4 g total) by mouth 3 (three) times daily with meals. (Patient taking differently: Take 4 g by mouth daily. ) 60 each 12  . ibuprofen (ADVIL) 200 MG tablet Take 600 mg by mouth every 6 (six) hours as needed for headache or moderate pain.     No current facility-administered medications for this visit.    Allergies as of 05/01/2019  . (No Known Allergies)    Family History  Problem Relation Age of Onset  . Hypertension Mother   . Diabetes Mother   . Kidney cancer Mother   . Hyperlipidemia Mother   . Stomach cancer Father   . Brain cancer Father   . Hypertension Father   . Hyperlipidemia Father   . Colon cancer Father 47  . Thyroid disease Sister   . Colon cancer Brother 26       ? surgical treatment  . Prostate cancer Brother   . Heart failure Maternal Grandmother   . Heart disease Maternal Grandmother   . Heart disease Paternal Grandmother   . Heart failure Paternal Grandmother     Social History   Socioeconomic History  . Marital status: Married    Spouse name: Not on file  . Number of children: Not on file  . Years of education: Not on file  . Highest education level: Not on file  Occupational History  . Not on file  Tobacco Use  . Smoking status: Never Smoker  . Smokeless tobacco: Never Used  Substance and Sexual Activity  . Alcohol use: No  . Drug use: No  . Sexual activity: Not on file  Other Topics Concern  . Not on file  Social History Narrative  . Not on file   Social Determinants of Health   Financial Resource Strain:   . Difficulty of Paying Living Expenses: Not on file  Food Insecurity:   .  Worried About Programme researcher, broadcasting/film/video in the Last Year: Not on file  . Ran Out of Food in the Last Year: Not on file  Transportation Needs:   . Lack of Transportation (Medical): Not on file  . Lack of Transportation (Non-Medical): Not on file  Physical Activity:   . Days of Exercise per Week: Not on file  . Minutes of Exercise per Session: Not on file  Stress:   . Feeling of Stress : Not on file  Social Connections:   . Frequency of Communication with Friends and Family: Not on file  . Frequency of Social Gatherings with Friends and Family: Not on file  . Attends Religious Services: Not on file  . Active Member of Clubs or Organizations: Not on file  . Attends Banker Meetings: Not on file  . Marital Status: Not on file    Review of Systems: General: Negative for anorexia, weight loss, fever,  chills, fatigue, weakness. ENT: Negative for hoarseness, difficulty swallowing. CV: Negative for chest pain, angina, palpitations, peripheral edema.  Respiratory: Negative for dyspnea at rest, cough, sputum, wheezing.  GI: See history of present illness. Endo: Negative for unusual weight change.  Heme: Negative for bruising or bleeding. Allergy: Negative for rash or hives.   Physical Exam: BP 128/69   Pulse 73   Temp (!) 96.9 F (36.1 C) (Temporal)   Ht 6\' 3"  (1.905 m)   Wt (!) 333 lb 6.4 oz (151.2 kg)   BMI 41.67 kg/m  General:   Alert and oriented. Pleasant and cooperative. Well-nourished and well-developed.  Eyes:  Without icterus, sclera clear and conjunctiva pink.  Ears:  Normal auditory acuity. Cardiovascular:  S1, S2 present without murmurs appreciated. Extremities without clubbing or edema. Respiratory:  Clear to auscultation bilaterally. No wheezes, rales, or rhonchi. No distress.  Gastrointestinal:  +BS, soft, non-tender and non-distended. No HSM noted. No guarding or rebound. No masses appreciated.  Rectal:  Deferred  Musculoskalatal:  Symmetrical without gross  deformities. Skin:  Intact without significant lesions or rashes. Neurologic:  Alert and oriented x4;  grossly normal neurologically. Psych:  Alert and cooperative. Normal mood and affect. Heme/Lymph/Immune: No excessive bruising noted.    05/01/2019 10:00 AM   Disclaimer: This note was dictated with voice recognition software. Similar sounding words can inadvertently be transcribed and may not be corrected upon review.

## 2019-05-01 NOTE — Assessment & Plan Note (Signed)
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  Still having diarrhea about 50% of stools that is associated with abdominal pain that typically relieves after his bowel movement.  Query element of IBS overlay.  I will try Lomotil to see if this helps his stools anymore.  I feel improving his diarrhea will only help his rectal bleeding.  Follow-up in 2 months.  Call if no improvement in 2-4 weeks.

## 2019-05-01 NOTE — Patient Instructions (Signed)
Your health issues we discussed today were:   Diarrhea:  1. I have sent a prescription for Lomotil to your pharmacy.  Take this up to 4 times a day as needed for diarrhea 2. Call us in 2 to 4 weeks if this does not help your diarrhea 3. Call us for any worsening or severe symptoms  Rectal bleeding: 1. As we discussed, you may have a possible fissure due to the frequent and large number of stools you have been having 2. I sent in a rectal cream to [facility].we will have a small amount of steroid, small amount of numbing medicine for pain, and small amount of diltiazem (which will help heal any fissure or tear) 3. Call us in 2 to 4 weeks if you are still having significant rectal bleeding 4. If you notice a large amount of rectal bleeding call us and we can check labs to ensure that it is not a large amount of bleeding  Overall I recommend:  1. Return for follow-up in 2 months 2. Call us if you have any questions or concerns 3. Continue your other current medications   ---------------------------------------------------------------  COVID-19 Vaccine Information can be found at: [email or web address] For questions related to vaccine distribution or appointments, please email [email or web address] or call [phone].   ---------------------------------------------------------------   At [facility] we value your feedback. You may receive a survey about your visit today. Please share your experience as we strive to create trusting relationships with our patients to provide genuine, compassionate, quality care.  We appreciate your understanding and patience as we review any laboratory studies, imaging, and other diagnostic tests that are ordered as we care for you. Our office policy is 5 business days for review of these results, and any emergent or urgent results are addressed in a timely manner for your best  interest. If you do not hear from our office in 1 week, please contact us.   We also encourage the use of MyChart, which contains your medical information for your review as well. If you are not enrolled in this feature, an access code is on this after visit summary for your convenience. Thank you for allowing [place] to be involved in your care.  It was great to see you today!  I hope you have a great day!!

## 2019-05-01 NOTE — Telephone Encounter (Signed)
Referral sent to genetics via Epic.

## 2019-05-01 NOTE — Assessment & Plan Note (Signed)
Patient has persistent rectal bleeding.  Colonoscopy did not reveal any significant source.  He was referred to surgery for hemorrhoids but it was felt that hemorrhoids are not likely an issue.  Possible previous rectal fissure aggravated by worsening diarrhea.  He still having rectal bleeding with most bowel movements along with sharp rectal pain.  Query possible fissure given no other findings on imaging and colonoscopy.  I will send [facility] cream with lidocaine and Cardizem to help you any possible rectal fissure.  Further management with better control diarrhea as per above.  Follow-up 2 months.

## 2019-05-01 NOTE — Telephone Encounter (Signed)
Can we please refer this patient to genetic counseling due to significant family history of CRC (Father at age 22 and brother at age 42; also mother kidney CA, father gastric CA and brain CA, brother prostate CA)

## 2019-05-03 ENCOUNTER — Telehealth: Payer: Self-pay | Admitting: Licensed Clinical Social Worker

## 2019-05-03 NOTE — Telephone Encounter (Signed)
Received a genetic counseling referral from [facility] for fhx of cancer. Devin Smith has been cld and scheduled to see Devin Smith for a mychart video visit on 3/1 at 3pm. Pt declined to add his email at this time. I explained that I would have the appt link sent via text. Pt voiced understanding.

## 2019-05-07 ENCOUNTER — Other Ambulatory Visit: Payer: Self-pay | Admitting: Gastroenterology

## 2019-05-07 ENCOUNTER — Ambulatory Visit: Payer: PRIVATE HEALTH INSURANCE | Admitting: General Surgery

## 2019-05-07 DIAGNOSIS — K529 Noninfective gastroenteritis and colitis, unspecified: Secondary | ICD-10-CM

## 2019-05-07 DIAGNOSIS — R1084 Generalized abdominal pain: Secondary | ICD-10-CM

## 2019-05-07 DIAGNOSIS — K625 Hemorrhage of anus and rectum: Secondary | ICD-10-CM

## 2019-05-13 ENCOUNTER — Encounter: Payer: Self-pay | Admitting: Licensed Clinical Social Worker

## 2019-05-13 ENCOUNTER — Ambulatory Visit (HOSPITAL_BASED_OUTPATIENT_CLINIC_OR_DEPARTMENT_OTHER): Payer: PRIVATE HEALTH INSURANCE | Admitting: Licensed Clinical Social Worker

## 2019-05-13 DIAGNOSIS — Z801 Family history of malignant neoplasm of trachea, bronchus and lung: Secondary | ICD-10-CM | POA: Insufficient documentation

## 2019-05-13 DIAGNOSIS — Z800 Family history of malignant neoplasm of digestive organs: Secondary | ICD-10-CM | POA: Insufficient documentation

## 2019-05-13 DIAGNOSIS — Z8051 Family history of malignant neoplasm of kidney: Secondary | ICD-10-CM | POA: Diagnosis not present

## 2019-05-13 DIAGNOSIS — Z8042 Family history of malignant neoplasm of prostate: Secondary | ICD-10-CM | POA: Diagnosis not present

## 2019-05-13 NOTE — Progress Notes (Signed)
REFERRING PROVIDER: Carlis Stable, NP [address] [place],  [place] [address]  PRIMARY PROVIDER:  Lavella Lemons, PA  PRIMARY REASON FOR VISIT:  1. Family history of stomach cancer   2. Family history of prostate cancer   3. Family history of kidney cancer   4. Family history of lung cancer     I connected with Devin Smith on 05/13/2019 at 3:10 PM EDT by MyChart video and verified that I am speaking with the correct person using two identifiers.    Patient location: work Provider location: [place]  HISTORY OF PRESENT ILLNESS:   Devin Smith, a 24 y.o. male, was seen for a  cancer genetics consultation at the request of  Walden Field NP due to a family history of cancer.  Devin Smith presents to clinic today to discuss the possibility of a hereditary predisposition to cancer, genetic testing, and to further clarify his future cancer risks, as well as potential cancer risks for family members.   Devin Smith is a 24 y.o. male with no personal history of cancer.  He had a colonoscopy in 2020.  CANCER HISTORY:  Oncology History   No history exists.    Past Medical History:  Diagnosis Date  . Cardiomegaly   . Chest wall pain, chronic   . Family history of kidney cancer   . Family history of lung cancer   . Family history of prostate cancer   . Family history of stomach cancer   . Pectus carinatum     Past Surgical History:  Procedure Laterality Date  . BIOPSY  12/31/2018   Procedure: BIOPSY;  Surgeon: Danie Binder, MD;  [facility];  Service: Endoscopy;;  Ileal, Right , left colon biopsies   . 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 COLONOSCOPY WITH PROPOFOL N/A 12/31/2018   Procedure: COLONOSCOPY WITH PROPOFOL;  Surgeon: Danie Binder, MD;  Location: [facility];  Service: Endoscopy;  Laterality: N/A;  10:15am  . Ravitch for pecuts carinatum on 10/14/2011      Social History   Socioeconomic History  . Marital status: Married   Spouse name: Not on file  . Number of children: Not on file  . Years of education: Not on file  . Highest education level: Not on file  Occupational History  . Not on file  Tobacco Use  . Smoking status: Never Smoker  . Smokeless tobacco: Never Used  Substance and Sexual Activity  . Alcohol use: No  . Drug use: No  . Sexual activity: Not on file  Other Topics Concern  . Not on file  Social History Narrative  . Not on file   Social Determinants of Health   Financial Resource Strain:   . Difficulty of Paying Living Expenses: Not on file  Food Insecurity:   . Worried About Charity fundraiser in the Last Year: Not on file  . Ran Out of Food in the Last Year: Not on file  Transportation Needs:   . Lack of Transportation (Medical): Not on file  . Lack of Transportation (Non-Medical): Not on file  Physical Activity:   . Days of Exercise per Week: Not on file  . Minutes of Exercise per Session: Not on file  Stress:   . Feeling of Stress : Not on file  Social Connections:   . Frequency of Communication with Friends and Family: Not on file  . Frequency of Social Gatherings with Friends and  Family: Not on file  . Attends Religious Services: Not on file  . Active Member of Clubs or Organizations: Not on file  . Attends Archivist Meetings: Not on file  . Marital Status: Not on file     FAMILY HISTORY:  We obtained a detailed, 4-generation family history.  Significant diagnoses are listed below: Family History  Problem Relation Age of Onset  . Hypertension Mother   . Diabetes Mother   . Kidney cancer Mother 60  . Hyperlipidemia Mother   . Stomach cancer Father 26  . Brain cancer Father 78  . Hypertension Father   . Hyperlipidemia Father   . Prostate cancer Father 75  . Thyroid disease Sister   . Heart failure Maternal Grandmother   . Heart disease Maternal Grandmother   . Lung cancer Maternal Grandmother 78  . Heart disease Paternal Grandmother   . Heart  failure Paternal Grandmother   . Lung cancer Paternal Grandmother 31  . Stomach cancer Paternal Uncle 4  . Stomach cancer Half-Brother 63       "no genetic testing"   Devin Smith has 2 full sisters, no cancers. He has 1 paternal half brother and 1 paternal half sister, and a maternal half brother and sister as well. His paternal half brother had stomach cancer at 77 and is living at 58, patient reports he has not had genetic testing.   Devin Smith father was diagnosed with prostate cancer, stomach cancer and a brain tumor at 1 and died at 6. Patient had 2 paternal uncles and 4 paternal aunts. One of his uncles also recently died of stomach cancer at 72. Paternal grandmother had lung cancer and history of smoking, died at 43.  Devin Smith mother was diagnosed with kidney cancer at 84 and is living at 41. Patient has 1 maternal uncle and 3 maternal aunts, no cancers. Maternal grandmother had lung cancer at 74.   Devin Smith is unaware of previous family history of genetic testing for hereditary cancer risks. Patient's maternal ancestors are of Caucasian descent, and paternal ancestors are of Caucasian descent. There is no reported Ashkenazi Jewish ancestry. There is no known consanguinity.  GENETIC COUNSELING ASSESSMENT: Devin Smith is a 24 y.o. male with a family history of stomach cancer which is somewhat suggestive of a hereditary cancer syndrome and predisposition to cancer. We, therefore, discussed and recommended the following at today's visit.   DISCUSSION: We discussed that 5 - 10% of cancer is hereditary. We discussed that there are several genes associated with a hereditary predisposition to stomach cancer, such as the Lynch syndrome genes.  There are other genes that can be associated with hereditary stomach cancer syndromes.  We discussed that testing is beneficial for several reasons including  knowing how to follow individuals for cancer screenings and understand if other family members  could be at risk for cancer and allow them to undergo genetic testing.   We reviewed the characteristics, features and inheritance patterns of hereditary cancer syndromes. We also discussed genetic testing, including the appropriate family members to test, the process of testing, insurance coverage and turn-around-time for results. We discussed the implications of a negative, positive and/or variant of uncertain significant result. We recommended Devin Smith pursue genetic testing for the Common Hereditary Cancers gene panel.   The Common Hereditary Cancers Panel offered by [facility] includes sequencing and/or deletion duplication testing of the following 48 genes: APC, ATM, AXIN2, BARD1, BMPR1A, BRCA1, BRCA2, BRIP1, CDH1, CDKN2A (p14ARF), CDKN2A (p16INK4a), CKD4,  CHEK2, CTNNA1, DICER1, EPCAM (Deletion/duplication testing only), GREM1 (promoter region deletion/duplication testing only), KIT, MEN1, MLH1, MSH2, MSH3, MSH6, MUTYH, NBN, NF1, NHTL1, PALB2, PDGFRA, PMS2, POLD1, POLE, PTEN, RAD50, RAD51C, RAD51D, RNF43, SDHB, SDHC, SDHD, SMAD4, SMARCA4. STK11, TP53, TSC1, TSC2, and VHL.  The following genes were evaluated for sequence changes only: SDHA and HOXB13 c.251G>A variant only.  Based on Devin Smith family history of cancer, he meets medical criteria for genetic testing. Despite that he meets criteria, he may still have an out of pocket cost.   PLAN: After considering the risks, benefits, and limitations, Devin Smith provided informed consent to pursue genetic testing. He will be getting a new insurance card this week and we will wait to put the order in until we receive a copy. Once we do, a saliva sample will be sent to his home and sample will be sent to [facility] for analysis of the Common Hereditary Cancers Panel. Results should be available within approximately 2-3 weeks' time, at which point they will be disclosed by telephone to Mr. Laraia, as will any additional recommendations warranted  by these results. Devin Smith will receive a summary of his genetic counseling visit and a copy of his results once available. This information will also be available in Epic.   Devin Smith questions were answered to his satisfaction today. Our contact information was provided should additional questions or concerns arise. Thank you for the referral and allowing [place] to share in the care of your patient.   Faith Rogue, MS, [facility] Genetic Counselor [email or web address] Phone: [phone]  The patient was seen for a total of 30 minutes in face-to-face genetic counseling.  Drs. Magrinat, Lindi Adie and/or Burr Medico were available for discussion regarding this case.   _______________________________________________________________________ For Office Staff:  Number of people involved in session: 1 Was an Intern/ student involved with case: no

## 2019-05-16 ENCOUNTER — Ambulatory Visit
Admission: RE | Admit: 2019-05-16 | Discharge: 2019-05-16 | Disposition: A | Discharge status: Home / Self Care | Payer: PRIVATE HEALTH INSURANCE | Source: Ambulatory Visit | Attending: Gastroenterology | Admitting: Gastroenterology

## 2019-05-16 DIAGNOSIS — R1084 Generalized abdominal pain: Secondary | ICD-10-CM

## 2019-05-16 DIAGNOSIS — K625 Hemorrhage of anus and rectum: Secondary | ICD-10-CM

## 2019-05-16 DIAGNOSIS — K529 Noninfective gastroenteritis and colitis, unspecified: Secondary | ICD-10-CM

## 2019-05-16 MED ORDER — IOPAMIDOL (ISOVUE-300) INJECTION 61%
125.0000 mL | Freq: Once | INTRAVENOUS | Status: AC | PRN
  Administered 2019-05-16: 125 mL via INTRAVENOUS

## 2019-06-26 ENCOUNTER — Encounter: Payer: Self-pay | Admitting: Gastroenterology

## 2019-06-26 ENCOUNTER — Telehealth: Payer: Self-pay | Admitting: Gastroenterology

## 2019-06-26 ENCOUNTER — Ambulatory Visit: Payer: PRIVATE HEALTH INSURANCE | Admitting: Nurse Practitioner

## 2019-06-26 NOTE — Progress Notes (Deleted)
Referring Provider: Lavella Lemons, PA Primary Care Physician:  Lavella Lemons, PA Primary GI:  Dr.   Rayne Du chief complaint on file.   HPI:   Devin Smith is a 24 y.o. male who presents for follow-up on diarrhea and rectal bleeding.  The patient was last seen in our office 05/01/2019 for rectal bleeding, functional diarrhea, family history of colon cancer.  His family history is colon cancer in his father and his brother, age 56 and 55 respectively.  Previously with sharp abdominal pain and rectal bleeding with diarrhea postprandially which all started after cholecystectomy.  Colonoscopy was updated 12/31/2018 which found no obvious source for rectal bleeding status post terminal ileal and colon biopsies but found focal active ileitis otherwise were unremarkable.  Differentials for ileitis include backwash ileitis as well as infection.  Recommendation of Imodium slow down his diarrhea but not resolve it so he was started on Bentyl for which he notified us of persistent diarrhea with a teaspoon of blood each time.  Recommended CBC which was normal and referred to surgery for anoscopy and consideration of hemorrhoidectomy.  Surgery recommended Rectiv for anal pain and possible healing posterior anal fissure aggravated by postcholecystectomy diarrhea.  He was also started on cholestyramine.  Notes indicated attempted to transfer care to [facility].  It does not appear he transferred care.  As last visit he noted still with rectal bleeding despite Rectiv, diarrhea somewhat improved and now intermittent ("a whole lot better than it was a year ago").  Family doctor was requesting a second opinion.  Rectal bleeding every other bowel movement, bowel movement after every oral intake.  Still on cholestyramine with intermittent abdominal pain in his mid abdomen that resolved after a bowel movement.  No other overt GI complaints.  Also noted sharp rectal pain when he has bleeding and query possible fissure.   Recommended trial of Lomotil up to 4 times a day as needed in addition cholestyramine, call in 2 to 4 weeks if no improvement, rectal cream to [place] compounded with Anusol, lidocaine, diltiazem.  Call in 2 to 4 weeks if persistent symptoms.  Follow-up in 2 months.  Also recommended consideration of genetic screening due to family history.  No progress report noted  The patient was seen by genetic counseling 05/13/2019 and recommended proceeding with genetic testing.  No results of genetic testing available as of yet.  Today he states     Past Medical History:  Diagnosis Date  . Cardiomegaly   . Chest wall pain, chronic   . Family history of kidney cancer   . Family history of lung cancer   . Family history of prostate cancer   . Family history of stomach cancer   . Pectus carinatum     Past Surgical History:  Procedure Laterality Date  . BIOPSY  12/31/2018   Procedure: BIOPSY;  Surgeon: Danie Binder, MD;  [facility];  Service: Endoscopy;;  Ileal, Right , left colon biopsies   . 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 COLONOSCOPY WITH PROPOFOL N/A 12/31/2018   Procedure: COLONOSCOPY WITH PROPOFOL;  Surgeon: Danie Binder, MD;  [facility]: [facility];  Service: Endoscopy;  Laterality: N/A;  10:15am  . Ravitch for pecuts carinatum on 10/14/2011      Current Outpatient Medications  Medication Sig Dispense Refill  . acetaminophen (TYLENOL) 500 MG tablet Take 1,000 mg by mouth every 6 (six) hours as needed for moderate pain  or headache.    . cholestyramine (QUESTRAN) 4 g packet Take 1 packet (4 g total) by mouth 3 (three) times daily with meals. (Patient taking differently: Take 4 g by mouth daily. ) 60 each 12  . diphenoxylate-atropine (LOMOTIL) 2.5-0.025 MG tablet Take 1 tablet by mouth 4 (four) times daily as needed for diarrhea or loose stools. 30 tablet 3  . ibuprofen (ADVIL) 200 MG tablet Take 600 mg by mouth every 6 (six) hours as  needed for headache or moderate pain.     No current facility-administered medications for this visit.    Allergies as of 06/26/2019  . (No Known Allergies)    Family History  Problem Relation Age of Onset  . Hypertension Mother   . Diabetes Mother   . Kidney cancer Mother 17  . Hyperlipidemia Mother   . Stomach cancer Father 48  . Brain cancer Father 75  . Hypertension Father   . Hyperlipidemia Father   . Prostate cancer Father 30  . Thyroid disease Sister   . Heart failure Maternal Grandmother   . Heart disease Maternal Grandmother   . Lung cancer Maternal Grandmother 75  . Heart disease Paternal Grandmother   . Heart failure Paternal Grandmother   . Lung cancer Paternal Grandmother 81  . Stomach cancer Paternal Uncle 27  . Stomach cancer Half-Brother 25       "no genetic testing"    Social History   Socioeconomic History  . Marital status: Married    Spouse name: Not on file  . Number of children: Not on file  . Years of education: Not on file  . Highest education level: Not on file  Occupational History  . Not on file  Tobacco Use  . Smoking status: Never Smoker  . Smokeless tobacco: Never Used  Substance and Sexual Activity  . Alcohol use: No  . Drug use: No  . Sexual activity: Not on file  Other Topics Concern  . Not on file  Social History Narrative  . Not on file   Social Determinants of Health   Financial Resource Strain:   . Difficulty of Paying Living Expenses:   Food Insecurity:   . Worried About Programme researcher, broadcasting/film/video in the Last Year:   . Barista in the Last Year:   Transportation Needs:   . Freight forwarder (Medical):   Marland Kitchen Lack of Transportation (Non-Medical):   Physical Activity:   . Days of Exercise per Week:   . Minutes of Exercise per Session:   Stress:   . Feeling of Stress :   Social Connections:   . Frequency of Communication with Friends and Family:   . Frequency of Social Gatherings with Friends and Family:   .  Attends Religious Services:   . Active Member of Clubs or Organizations:   . Attends Banker Meetings:   Marland Kitchen Marital Status:     Subjective: ROS   Objective: There were no vitals taken for this visit. Physical Exam    06/26/2019 1:19 PM   Disclaimer: This note was dictated with voice recognition software. Similar sounding words can inadvertently be transcribed and may not be corrected upon review.

## 2019-06-26 NOTE — Telephone Encounter (Signed)
PATIENT WAS A NO SHOW AND LETTER SENT  °

## 2019-06-27 ENCOUNTER — Encounter: Payer: Self-pay | Admitting: Nurse Practitioner

## 2019-06-27 ENCOUNTER — Telehealth: Payer: Self-pay | Admitting: *Deleted

## 2019-06-27 ENCOUNTER — Ambulatory Visit: Payer: PRIVATE HEALTH INSURANCE | Attending: Internal Medicine

## 2019-06-27 DIAGNOSIS — Z23 Encounter for immunization: Secondary | ICD-10-CM

## 2019-06-27 NOTE — Telephone Encounter (Signed)
fowarding to EG, Dr. Darrick Penna and CM

## 2019-06-27 NOTE — Telephone Encounter (Signed)
PT TRANSFERRED CARE. CANCEL ALL FUTURE APPTS.

## 2019-06-27 NOTE — Telephone Encounter (Signed)
Devin Smith to Anice Paganini, NP     06/27/19 12:11 PM Dr Dillon Bjork at [facility]   Me to Lonn, Im     06/27/19 12:10 PM Who is your new GI doctor so we can mark this in your chart. Thanks  Last read by Devin Smith at 12:10 PM on 06/27/2019. Devin Smith to Anice Paganini, NP     06/27/19 12:00 PM I have a new gi dr

## 2019-06-27 NOTE — Progress Notes (Signed)
   Covid-19 Vaccination Clinic  Name:  Myers Tutterow    MRN: [id number] DOB: [birth year]  06/27/2019  Mr. Petties was observed post Covid-19 immunization for 15 minutes without incident. He was provided with Vaccine Information Sheet and instruction to access the V-Safe system.   Mr. Karnes was instructed to call 911 with any severe reactions post vaccine: Marland Kitchen Difficulty breathing  . Swelling of face and throat  . A fast heartbeat  . A bad rash all over body  . Dizziness and weakness   Immunizations Administered    Name Date Dose VIS Date Route   Pfizer COVID-19 Vaccine 06/27/2019 11:57 AM 0.3 mL 02/22/2019 Intramuscular   Manufacturer: [employer], [employer]   Lot: [id number]   NDC: [address]-1

## 2019-06-27 NOTE — Telephone Encounter (Signed)
Noted  

## 2019-06-28 NOTE — Telephone Encounter (Signed)
All future appts cancelled and a note made in the pts chart

## 2019-07-22 ENCOUNTER — Ambulatory Visit: Payer: PRIVATE HEALTH INSURANCE

## 2019-08-13 ENCOUNTER — Ambulatory Visit: Payer: Self-pay | Attending: Internal Medicine

## 2019-08-13 DIAGNOSIS — Z23 Encounter for immunization: Secondary | ICD-10-CM

## 2019-08-13 NOTE — Progress Notes (Signed)
   Covid-19 Vaccination Clinic  Name:  Logan Baltimore    MRN: [id number] DOB: May 07, 1995  08/13/2019  Mr. Wlodarczyk was observed post Covid-19 immunization for 15 minutes without incident. He was provided with Vaccine Information Sheet and instruction to access the V-Safe system.   Mr. Gravley was instructed to call 911 with any severe reactions post vaccine: Marland Kitchen Difficulty breathing  . Swelling of face and throat  . A fast heartbeat  . A bad rash all over body  . Dizziness and weakness   Immunizations Administered    Name Date Dose VIS Date Route   Pfizer COVID-19 Vaccine 08/13/2019 12:57 PM 0.3 mL 05/08/2018 Intramuscular   Manufacturer: [employer], [employer]   Lot: [id number]   NDC: [address]-0

## 2020-04-16 ENCOUNTER — Other Ambulatory Visit: Payer: Self-pay | Admitting: Gastroenterology

## 2020-04-16 DIAGNOSIS — K449 Diaphragmatic hernia without obstruction or gangrene: Secondary | ICD-10-CM

## 2020-04-17 ENCOUNTER — Ambulatory Visit
Admission: RE | Admit: 2020-04-17 | Discharge: 2020-04-17 | Disposition: A | Discharge status: Home / Self Care | Payer: 59 | Source: Ambulatory Visit | Attending: Gastroenterology | Admitting: Gastroenterology

## 2020-04-17 DIAGNOSIS — K449 Diaphragmatic hernia without obstruction or gangrene: Secondary | ICD-10-CM

## 2020-04-17 MED ORDER — IOPAMIDOL (ISOVUE-300) INJECTION 61%
100.0000 mL | Freq: Once | INTRAVENOUS | Status: AC | PRN
  Administered 2020-04-17: 100 mL via INTRAVENOUS

## 2020-04-22 ENCOUNTER — Ambulatory Visit: Payer: Self-pay | Admitting: General Surgery

## 2020-04-22 NOTE — H&P (Signed)
History of Present Illness Devin Ok MD; 04/22/2020 3:10 PM) The patient is a 25 year old male presenting for a post-operative visit. Patient is a 25 year old male, who comes in secondary to history of chronic GERD. Patient had a previous Nissen fundoplication at age 62. This approximately 11 years ago. This was clamped at this hospital. There is no mesh placed at that time. Patient had continued reflux. He's been studied several times. Dr. Therisa Doyne has done an upper endoscopy which revealed a type III sliding hiatal hernia. Patient underwent a CT scan which revealed no widening of the hiatus.  Patient does state that he has significant reflux. He states that he wakes up with water brash, sore mouth, sleep on two pillows secondary to wheezing.   Patient's had a previous laparoscopic cholecystectomy. He's not manometry.   Allergies Lindwood Coke, RN; 04/22/2020 2:58 PM) No Known Allergies  [04/22/2020]: Allergies Reconciled   Medication History (Diane Herrin, RN; 04/22/2020 2:59 PM) Humira Pen (40MG /0.4ML Pen-inj Kit, Subcutaneous) Active. Pantoprazole Sodium (40MG  Tablet DR, Oral) Active. Medications Reconciled    Review of Systems Devin Ok, MD; 04/22/2020 3:11 PM) General Not Present- Appetite Loss, Chills, Fatigue, Fever, Night Sweats, Weight Gain and Weight Loss. Skin Not Present- Change in Wart/Mole, Dryness, Hives, Jaundice, New Lesions, Non-Healing Wounds, Rash and Ulcer. HEENT Present- Wears glasses/contact lenses. Not Present- Earache, Hearing Loss, Hoarseness, Nose Bleed, Oral Ulcers, Ringing in the Ears, Seasonal Allergies, Sinus Pain, Sore Throat, Visual Disturbances and Yellow Eyes. Breast Not Present- Breast Mass, Breast Pain, Nipple Discharge and Skin Changes. Cardiovascular Not Present- Chest Pain, Difficulty Breathing Lying Down, Leg Cramps, Palpitations, Rapid Heart Rate, Shortness of Breath and Swelling of Extremities. Gastrointestinal Not Present-  Abdominal Pain, Bloating, Bloody Stool, Change in Bowel Habits, Chronic diarrhea, Constipation, Difficulty Swallowing, Excessive gas, Gets full quickly at meals, Hemorrhoids, Indigestion, Nausea, Rectal Pain and Vomiting. Musculoskeletal Not Present- Back Pain, Joint Pain, Joint Stiffness, Muscle Pain, Muscle Weakness and Swelling of Extremities. Neurological Not Present- Decreased Memory, Fainting, Headaches, Numbness, Seizures, Tingling, Tremor, Trouble walking and Weakness. Psychiatric Not Present- Anxiety, Bipolar, Change in Sleep Pattern, Depression, Fearful and Frequent crying. Endocrine Not Present- Cold Intolerance, Excessive Hunger, Hair Changes, Heat Intolerance and New Diabetes. Hematology Not Present- Blood Thinners, Easy Bruising, Excessive bleeding, Gland problems, HIV and Persistent Infections.  Vitals (Diane Herrin RN; 04/22/2020 2:59 PM) 04/22/2020 2:59 PM Weight: 339.13 lb Height: 75in Body Surface Area: 2.75 m Body Mass Index: 42.39 kg/m  Temp.: 98.50F  Pulse: 113 (Regular)  P.OX: 98% (Room air) BP: 146/88(Sitting, Left Arm, Standard)       Physical Exam Devin Ok MD; 04/22/2020 3:10 PM) The physical exam findings are as follows: Note: Constitutional: No acute distress, conversant, appears stated age  Eyes: Anicteric sclerae, moist conjunctiva, no lid lag  Neck: No thyromegaly, trachea midline, no cervical lymphadenopathy  Lungs: Clear to auscultation biilaterally, normal respiratory effot  Cardiovascular: regular rate & rhythm, no murmurs, no peripheal edema, pedal pulses 2+  GI: Soft, no masses or hepatosplenomegaly, non-tender to palpation  MSK: Normal gait, no clubbing cyanosis, edema  Skin: No rashes, palpation reveals normal skin turgor  Psychiatric: Appropriate judgment and insight, oriented to person, place, and time  General Mental Status-Alert. General Appearance-Consistent with stated age. Hydration-Well  hydrated. Voice-Normal.  Abdomen Inspection Inspection of the abdomen reveals - Incision healing well without signs of infections or seroma.    Assessment & Plan Devin Ok MD; 04/22/2020 3:11 PM) SLIDING HIATAL HERNIA (K44.9) Impression: Patient is a 25 year old  male, with recurrent sliding type III hiatal hernia. 1. Will proceed to the operating room for a robotic hiatal hernia repair with mesh and fundoplication  2. Discussed with patient the risks and benefits of the procedure to include but not limited to: Infection, bleeding, damage to structures, possible pneumothorax, possible recurrence. The patient voiced understanding and wishes to proceed.

## 2020-04-23 ENCOUNTER — Other Ambulatory Visit: Payer: Self-pay | Admitting: Gastroenterology

## 2020-05-08 ENCOUNTER — Other Ambulatory Visit (HOSPITAL_COMMUNITY)
Admission: RE | Admit: 2020-05-08 | Discharge: 2020-05-08 | Disposition: A | Discharge status: Home / Self Care | Payer: 59 | Source: Ambulatory Visit | Attending: Gastroenterology | Admitting: Gastroenterology

## 2020-05-08 DIAGNOSIS — Z20822 Contact with and (suspected) exposure to covid-19: Secondary | ICD-10-CM | POA: Diagnosis not present

## 2020-05-08 DIAGNOSIS — Z01812 Encounter for preprocedural laboratory examination: Secondary | ICD-10-CM | POA: Insufficient documentation

## 2020-05-08 LAB — SARS CORONAVIRUS 2 (TAT 6-24 HRS): SARS Coronavirus 2: NEGATIVE

## 2020-05-11 ENCOUNTER — Encounter (HOSPITAL_COMMUNITY)
Admission: RE | Disposition: A | Discharge status: Home / Self Care | Payer: Self-pay | Source: Home / Self Care | Attending: Gastroenterology

## 2020-05-11 ENCOUNTER — Ambulatory Visit (HOSPITAL_COMMUNITY)
Admission: RE | Admit: 2020-05-11 | Discharge: 2020-05-11 | Disposition: A | Discharge status: Home / Self Care | Payer: 59 | Attending: Gastroenterology | Admitting: Gastroenterology

## 2020-05-11 ENCOUNTER — Encounter (HOSPITAL_COMMUNITY): Payer: Self-pay | Admitting: Gastroenterology

## 2020-05-11 DIAGNOSIS — Z01818 Encounter for other preprocedural examination: Secondary | ICD-10-CM | POA: Diagnosis not present

## 2020-05-11 DIAGNOSIS — K469 Unspecified abdominal hernia without obstruction or gangrene: Secondary | ICD-10-CM | POA: Diagnosis present

## 2020-05-11 HISTORY — PX: ESOPHAGEAL MANOMETRY: SHX5429

## 2020-05-11 SURGERY — MANOMETRY, ESOPHAGUS
Anesthesia: Monitor Anesthesia Care

## 2020-05-11 MED ORDER — LIDOCAINE VISCOUS HCL 2 % MT SOLN
OROMUCOSAL | Status: AC
  Filled 2020-05-11: qty 15

## 2020-05-11 SURGICAL SUPPLY — 2 items
FACESHIELD LNG OPTICON STERILE (SAFETY) IMPLANT
GLOVE BIO SURGEON STRL SZ8 (GLOVE) ×4 IMPLANT

## 2020-05-11 NOTE — Progress Notes (Signed)
Esophageal manometry performed per protocol.  Patient tolerated procedure well.  Report to be sent to Dr.Karki,A.

## 2020-05-19 ENCOUNTER — Other Ambulatory Visit: Payer: Self-pay | Admitting: Gastroenterology

## 2020-05-19 ENCOUNTER — Other Ambulatory Visit: Payer: Self-pay

## 2020-05-19 ENCOUNTER — Ambulatory Visit
Admission: RE | Admit: 2020-05-19 | Discharge: 2020-05-19 | Disposition: A | Discharge status: Home / Self Care | Payer: 59 | Source: Ambulatory Visit | Attending: Gastroenterology | Admitting: Gastroenterology

## 2020-05-19 DIAGNOSIS — R042 Hemoptysis: Secondary | ICD-10-CM

## 2020-05-27 ENCOUNTER — Encounter: Payer: Self-pay | Admitting: Pulmonary Disease

## 2020-05-27 ENCOUNTER — Other Ambulatory Visit: Payer: Self-pay

## 2020-05-27 ENCOUNTER — Ambulatory Visit (INDEPENDENT_AMBULATORY_CARE_PROVIDER_SITE_OTHER): Payer: 59 | Admitting: Pulmonary Disease

## 2020-05-27 VITALS — BP 120/70 | HR 88 | Temp 98.0°F | Ht 75.0 in | Wt 341.2 lb

## 2020-05-27 DIAGNOSIS — K219 Gastro-esophageal reflux disease without esophagitis: Secondary | ICD-10-CM | POA: Diagnosis not present

## 2020-05-27 DIAGNOSIS — K449 Diaphragmatic hernia without obstruction or gangrene: Secondary | ICD-10-CM | POA: Diagnosis not present

## 2020-05-27 DIAGNOSIS — R042 Hemoptysis: Secondary | ICD-10-CM | POA: Diagnosis not present

## 2020-05-27 DIAGNOSIS — K50919 Crohn's disease, unspecified, with unspecified complications: Secondary | ICD-10-CM

## 2020-05-27 NOTE — Progress Notes (Signed)
Synopsis: Referred in march 2022 for "coughing up blood" by Kerin Salen, MD  Subjective:   PATIENT ID: Devin Smith GENDER: male DOB: 25-Dec-1995, MRN: [id number]  Chief Complaint  Patient presents with  . Consult    Has been coughing up blood in the morning. Light red.  Has not been sick recently or had a cough.    This is a 25 year old gentleman, past medical history of Crohn's disease on Humira, hiatal hernia.  Plans for repair by surgery next week.  Patient had episodes of coughing up blood.  When asking detailed questions about when the blood appears it predominantly has a few light pink streaks of blood in the morning which is mixed with saliva.  Patient feels as if it is coming up from his stomach or esophagus because he is also occasionally associated with gagging sensation.  He has not had any sputum that has blood tinge to it.  He had a recent CT scan of the chest in February 2022 which was reassuring.  There was no significant parenchymal abnormality noted within the chest.  And he has only had 2 episodes of pink blood streaked saliva   Past Medical History:  Diagnosis Date  . Cardiomegaly   . Chest wall pain, chronic   . Family history of kidney cancer   . Family history of lung cancer   . Family history of prostate cancer   . Family history of stomach cancer   . Pectus carinatum      Family History  Problem Relation Age of Onset  . Hypertension Mother   . Diabetes Mother   . Kidney cancer Mother 31  . Hyperlipidemia Mother   . Stomach cancer Father 39  . Brain cancer Father 95  . Hypertension Father   . Hyperlipidemia Father   . Prostate cancer Father 59  . Thyroid disease Sister   . Heart failure Maternal Grandmother   . Heart disease Maternal Grandmother   . Lung cancer Maternal Grandmother 12  . Heart disease Paternal Grandmother   . Heart failure Paternal Grandmother   . Lung cancer Paternal Grandmother 52  . Stomach cancer Paternal Uncle 69  . Stomach  cancer Half-Brother 25       "no genetic testing"     Past Surgical History:  Procedure Laterality Date  . BIOPSY  12/31/2018   Procedure: BIOPSY;  Surgeon: West Bali, MD;  Location: [facility];  Service: Endoscopy;;  Ileal, Right , left colon biopsies   . 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 COLONOSCOPY WITH PROPOFOL N/A 12/31/2018   Procedure: COLONOSCOPY WITH PROPOFOL;  Surgeon: West Bali, MD;  Location: [facility];  Service: Endoscopy;  Laterality: N/A;  10:15am  . ESOPHAGEAL MANOMETRY N/A 05/11/2020   Procedure: ESOPHAGEAL MANOMETRY (EM);  Surgeon: Kerin Salen, MD;  Location: [facility];  Service: Gastroenterology;  Laterality: N/A;  Osvaldo Shipper for pecuts carinatum on 10/14/2011      Social History   Socioeconomic History  . Marital status: Married    Spouse name: Not on file  . Number of children: Not on file  . Years of education: Not on file  . Highest education level: Not on file  Occupational History  . Not on file  Tobacco Use  . Smoking status: Never Smoker  . Smokeless tobacco: Never Used  Vaping Use  . Vaping Use: Never used  Substance and Sexual Activity  . Alcohol use: No  . Drug  use: No  . Sexual activity: Not on file  Other Topics Concern  . Not on file  Social History Narrative  . Not on file   Social Determinants of Health   Financial Resource Strain: Not on file  Food Insecurity: Not on file  Transportation Needs: Not on file  Physical Activity: Not on file  Stress: Not on file  Social Connections: Not on file  Intimate Partner Violence: Not on file     No Known Allergies   Outpatient Medications Prior to Visit  Medication Sig Dispense Refill  . acetaminophen (TYLENOL) 325 MG tablet Take 650 mg by mouth every 6 (six) hours as needed (pain).    . Adalimumab (HUMIRA) 40 MG/0.4ML PSKT Inject 40 mg into the skin every 14 (fourteen) days.    Marland Kitchen ibuprofen (ADVIL) 200 MG tablet Take 600 mg by mouth every 6 (six)  hours as needed for headache or moderate pain.    Marland Kitchen ondansetron (ZOFRAN-ODT) 4 MG disintegrating tablet Take 4 mg by mouth 3 (three) times daily as needed.    . pantoprazole (PROTONIX) 40 MG tablet Take 40 mg by mouth 2 (two) times daily.    . sucralfate (CARAFATE) 1 GM/10ML suspension Take 1 g by mouth in the morning and at bedtime.     No facility-administered medications prior to visit.    Review of Systems  Constitutional: Negative for chills, fever, malaise/fatigue and weight loss.  HENT: Negative for hearing loss, sore throat and tinnitus.   Eyes: Negative for blurred vision and double vision.  Respiratory: Positive for hemoptysis. Negative for cough, sputum production, shortness of breath, wheezing and stridor.        ?or from stomach  Cardiovascular: Negative for chest pain, palpitations, orthopnea, leg swelling and PND.  Gastrointestinal: Negative for abdominal pain, constipation, diarrhea, heartburn, nausea and vomiting.  Genitourinary: Negative for dysuria, hematuria and urgency.  Musculoskeletal: Negative for joint pain and myalgias.  Skin: Negative for itching and rash.  Neurological: Negative for dizziness, tingling, weakness and headaches.  Endo/Heme/Allergies: Negative for environmental allergies. Does not bruise/bleed easily.  Psychiatric/Behavioral: Negative for depression. The patient is not nervous/anxious and does not have insomnia.   All other systems reviewed and are negative.    Objective:  Physical Exam Vitals reviewed.  Constitutional:      General: He is not in acute distress.    Appearance: He is well-developed. He is obese.  HENT:     Head: Normocephalic and atraumatic.  Eyes:     General: No scleral icterus.    Conjunctiva/sclera: Conjunctivae normal.     Pupils: Pupils are equal, round, and reactive to light.  Neck:     Vascular: No JVD.     Trachea: No tracheal deviation.  Cardiovascular:     Rate and Rhythm: Normal rate and regular rhythm.      Heart sounds: Normal heart sounds. No murmur heard.   Pulmonary:     Effort: Pulmonary effort is normal. No tachypnea, accessory muscle usage or respiratory distress.     Breath sounds: No stridor. No wheezing, rhonchi or rales.  Abdominal:     General: Bowel sounds are normal. There is no distension.     Palpations: Abdomen is soft.     Tenderness: There is no abdominal tenderness.  Musculoskeletal:        General: No tenderness.     Cervical back: Neck supple.  Lymphadenopathy:     Cervical: No cervical adenopathy.  Skin:    General: Skin  is warm and dry.     Capillary Refill: Capillary refill takes less than 2 seconds.     Findings: No rash.  Neurological:     Mental Status: He is alert and oriented to person, place, and time.  Psychiatric:        Behavior: Behavior normal.      Vitals:   05/27/20 1114  BP: 120/70  Pulse: 88  Temp: 98 F (36.7 C)  TempSrc: Tympanic  SpO2: 99%  Weight: (!) 341 lb 4 oz (154.8 kg)  Height: 6\' 3"  (1.905 m)   99% on RA BMI Readings from Last 3 Encounters:  05/27/20 42.65 kg/m  05/01/19 41.67 kg/m  04/09/19 41.62 kg/m   Wt Readings from Last 3 Encounters:  05/27/20 (!) 341 lb 4 oz (154.8 kg)  05/01/19 (!) 333 lb 6.4 oz (151.2 kg)  04/09/19 (!) 333 lb (151 kg)     CBC    Component Value Date/Time   WBC 6.5 02/27/2019 0845   RBC 5.27 02/27/2019 0845   HGB 15.5 02/27/2019 0845   HCT 45.9 02/27/2019 0845   PLT 335 02/27/2019 0845   MCV 87.1 02/27/2019 0845   MCH 29.4 02/27/2019 0845   MCHC 33.8 02/27/2019 0845   RDW 12.5 02/27/2019 0845   LYMPHSABS 1,918 02/27/2019 0845   MONOABS 0.6 08/31/2016 1600   EOSABS 143 02/27/2019 0845   BASOSABS 33 02/27/2019 0845    Chest Imaging: February 2022: CT imaging reviewed from 04/17/2020.  No significant parenchymal abnormality nothing to identify cause of hemoptysis. The patient's images have been independently reviewed by me.    Pulmonary Functions Testing Results: No  flowsheet data found.  FeNO:   Pathology:   Echocardiogram:   Heart Catheterization:     Assessment & Plan:     ICD-10-CM   1. Coughing up blood  R04.2   2. Gastroesophageal reflux disease, unspecified whether esophagitis present  K21.9   3. Crohn's disease with complication, unspecified gastrointestinal tract location (HCC)  K50.919   4. Hiatal hernia  K44.9     Discussion:  This is a 25 year old gentleman with complaints of occasional bringing up blood first thing in the morning.  It seems to be mixed with saliva versus sputum.  He has a reassuring CT scan of the chest and no other clear etiology of underlying hemoptysis.  He also has a planned surgery next week for hiatal hernia repair.  Plan: We discussed several options today. I do not think a repeat CT imaging is necessary at this time. Continue reflux management Continue acid suppression Planned surgery for hiatal hernia next week.  I think a conservative approach would be most appropriate.  If it gets worse then we can consider additional imaging and/or bronchoscopy.  At this time I think we should see if it gets better after surgical fixation of the hernia.   Current Outpatient Medications:  .  acetaminophen (TYLENOL) 325 MG tablet, Take 650 mg by mouth every 6 (six) hours as needed (pain)., Disp: , Rfl:  .  Adalimumab (HUMIRA) 40 MG/0.4ML PSKT, Inject 40 mg into the skin every 14 (fourteen) days., Disp: , Rfl:  .  ibuprofen (ADVIL) 200 MG tablet, Take 600 mg by mouth every 6 (six) hours as needed for headache or moderate pain., Disp: , Rfl:  .  ondansetron (ZOFRAN-ODT) 4 MG disintegrating tablet, Take 4 mg by mouth 3 (three) times daily as needed., Disp: , Rfl:  .  pantoprazole (PROTONIX) 40 MG tablet, Take 40 mg  by mouth 2 (two) times daily., Disp: , Rfl:  .  sucralfate (CARAFATE) 1 GM/10ML suspension, Take 1 g by mouth in the morning and at bedtime., Disp: , Rfl:   I spent 60 minutes dedicated to the care of this  patient on the date of this encounter to include pre-visit review of records, face-to-face time with the patient discussing conditions above, post visit ordering of testing, clinical documentation with the electronic health record, making appropriate referrals as documented, and communicating necessary findings to members of the patients care team.   Josephine IgoBradley L Sonya Gunnoe, DO [facility] 05/27/2020 11:26 AM

## 2020-05-27 NOTE — Patient Instructions (Signed)
Thank you for visiting Dr. Tonia Brooms at [facility]. Today we recommend the following:  Call us if bringing up blood worsens or changing.   Return if symptoms worsen or fail to improve.    Please do your part to reduce the spread of COVID-19.

## 2020-06-06 ENCOUNTER — Other Ambulatory Visit (HOSPITAL_COMMUNITY)
Admission: RE | Admit: 2020-06-06 | Discharge: 2020-06-06 | Disposition: A | Discharge status: Home / Self Care | Payer: 59 | Source: Ambulatory Visit | Attending: General Surgery | Admitting: General Surgery

## 2020-06-06 DIAGNOSIS — Z20822 Contact with and (suspected) exposure to covid-19: Secondary | ICD-10-CM | POA: Diagnosis not present

## 2020-06-06 DIAGNOSIS — Z01812 Encounter for preprocedural laboratory examination: Secondary | ICD-10-CM | POA: Diagnosis present

## 2020-06-06 LAB — SARS CORONAVIRUS 2 (TAT 6-24 HRS): SARS Coronavirus 2: NEGATIVE

## 2020-06-08 ENCOUNTER — Encounter (HOSPITAL_COMMUNITY): Payer: Self-pay | Admitting: General Surgery

## 2020-06-08 MED ORDER — DEXTROSE 5 % IV SOLN
3.0000 g | INTRAVENOUS | Status: AC
  Administered 2020-06-09: 3 g via INTRAVENOUS
  Filled 2020-06-08: qty 3

## 2020-06-08 NOTE — Progress Notes (Signed)
EKG: 03/18/16 CXR: 05/19/20 ECHO: denies Stress Test: denies Cardiac Cath: denies  Fasting Blood Sugar- na Checks Blood Sugar__na_ times a day  OSA/CPAP: No  ASA/Blood Thinners: No  Covid test 06/06/20 negative  Anesthesia Review: No  Patient denies shortness of breath, fever, cough, and chest pain at PAT appointment.  Patient verbalized understanding of instructions provided today at the PAT appointment.  Patient asked to review instructions at home and day of surgery.

## 2020-06-09 ENCOUNTER — Other Ambulatory Visit: Payer: Self-pay

## 2020-06-09 ENCOUNTER — Observation Stay (HOSPITAL_COMMUNITY)
Admission: RE | Admit: 2020-06-09 | Discharge: 2020-06-11 | Disposition: A | Discharge status: Home / Self Care | Payer: 59 | Attending: General Surgery | Admitting: General Surgery

## 2020-06-09 ENCOUNTER — Encounter (HOSPITAL_COMMUNITY)
Admission: RE | Disposition: A | Discharge status: Home / Self Care | Payer: Self-pay | Source: Home / Self Care | Attending: General Surgery

## 2020-06-09 ENCOUNTER — Encounter (HOSPITAL_COMMUNITY): Payer: Self-pay | Admitting: General Surgery

## 2020-06-09 ENCOUNTER — Ambulatory Visit (HOSPITAL_COMMUNITY): Payer: 59 | Admitting: Anesthesiology

## 2020-06-09 DIAGNOSIS — K219 Gastro-esophageal reflux disease without esophagitis: Secondary | ICD-10-CM | POA: Diagnosis not present

## 2020-06-09 DIAGNOSIS — Z9889 Other specified postprocedural states: Secondary | ICD-10-CM | POA: Diagnosis present

## 2020-06-09 DIAGNOSIS — K449 Diaphragmatic hernia without obstruction or gangrene: Principal | ICD-10-CM | POA: Insufficient documentation

## 2020-06-09 HISTORY — PX: XI ROBOTIC ASSISTED HIATAL HERNIA REPAIR: SHX6889

## 2020-06-09 HISTORY — PX: INSERTION OF MESH: SHX5868

## 2020-06-09 HISTORY — DX: Gastro-esophageal reflux disease without esophagitis: K21.9

## 2020-06-09 HISTORY — PX: OTHER SURGICAL HISTORY: SHX169

## 2020-06-09 HISTORY — DX: Other specified postprocedural states: Z98.890

## 2020-06-09 HISTORY — DX: Unspecified asthma, uncomplicated: J45.909

## 2020-06-09 LAB — CBC
HCT: 41.1 % (ref 39.0–52.0)
Hemoglobin: 14.7 g/dL (ref 13.0–17.0)
MCH: 30.4 pg (ref 26.0–34.0)
MCHC: 35.8 g/dL (ref 30.0–36.0)
MCV: 85.1 fL (ref 80.0–100.0)
Platelets: 388 10*3/uL (ref 150–400)
RBC: 4.83 MIL/uL (ref 4.22–5.81)
RDW: 12.4 % (ref 11.5–15.5)
WBC: 7.4 10*3/uL (ref 4.0–10.5)
nRBC: 0 % (ref 0.0–0.2)

## 2020-06-09 SURGERY — REPAIR, HERNIA, HIATAL, ROBOT-ASSISTED
Anesthesia: General | Site: Abdomen

## 2020-06-09 MED ORDER — ROCURONIUM BROMIDE 10 MG/ML (PF) SYRINGE
PREFILLED_SYRINGE | INTRAVENOUS | Status: AC
  Filled 2020-06-09: qty 10

## 2020-06-09 MED ORDER — DEXTROSE-NACL 5-0.9 % IV SOLN: INTRAVENOUS | Status: DC

## 2020-06-09 MED ORDER — PROPOFOL 10 MG/ML IV BOLUS
INTRAVENOUS | Status: AC
  Filled 2020-06-09: qty 20

## 2020-06-09 MED ORDER — BUPIVACAINE HCL (PF) 0.25 % IJ SOLN
INTRAMUSCULAR | Status: DC | PRN
  Administered 2020-06-09: 9 mL

## 2020-06-09 MED ORDER — CHLORHEXIDINE GLUCONATE CLOTH 2 % EX PADS: 6.0000 | MEDICATED_PAD | Freq: Once | CUTANEOUS | Status: DC

## 2020-06-09 MED ORDER — ONDANSETRON HCL 4 MG/2ML IJ SOLN
INTRAMUSCULAR | Status: DC | PRN
  Administered 2020-06-09: 4 mg via INTRAVENOUS

## 2020-06-09 MED ORDER — ONDANSETRON 4 MG PO TBDP: 4.0000 mg | ORAL_TABLET | Freq: Four times a day (QID) | ORAL | Status: DC | PRN

## 2020-06-09 MED ORDER — OXYCODONE HCL 5 MG/5ML PO SOLN: 5.0000 mg | Freq: Once | ORAL | Status: DC | PRN

## 2020-06-09 MED ORDER — PHENYLEPHRINE HCL-NACL 10-0.9 MG/250ML-% IV SOLN
INTRAVENOUS | Status: DC | PRN
  Administered 2020-06-09: 50 ug/min via INTRAVENOUS

## 2020-06-09 MED ORDER — DEXAMETHASONE SODIUM PHOSPHATE 10 MG/ML IJ SOLN
INTRAMUSCULAR | Status: DC | PRN
  Administered 2020-06-09: 10 mg via INTRAVENOUS

## 2020-06-09 MED ORDER — PROMETHAZINE HCL 25 MG/ML IJ SOLN: 6.2500 mg | INTRAMUSCULAR | Status: DC | PRN

## 2020-06-09 MED ORDER — HYDROMORPHONE HCL 1 MG/ML IJ SOLN
INTRAMUSCULAR | Status: AC
  Filled 2020-06-09: qty 1

## 2020-06-09 MED ORDER — ENSURE PRE-SURGERY PO LIQD: 296.0000 mL | Freq: Once | ORAL | Status: DC

## 2020-06-09 MED ORDER — PHENYLEPHRINE 40 MCG/ML (10ML) SYRINGE FOR IV PUSH (FOR BLOOD PRESSURE SUPPORT)
PREFILLED_SYRINGE | INTRAVENOUS | Status: AC
  Filled 2020-06-09: qty 10

## 2020-06-09 MED ORDER — HYDROMORPHONE HCL 1 MG/ML IJ SOLN
1.0000 mg | INTRAMUSCULAR | Status: DC | PRN
  Administered 2020-06-09: 2 mg via INTRAVENOUS
  Administered 2020-06-09 – 2020-06-11 (×10): 1 mg via INTRAVENOUS
  Filled 2020-06-09 (×2): qty 1
  Filled 2020-06-09: qty 2
  Filled 2020-06-09 (×8): qty 1

## 2020-06-09 MED ORDER — OXYCODONE HCL 5 MG PO TABS: 5.0000 mg | ORAL_TABLET | Freq: Once | ORAL | Status: DC | PRN

## 2020-06-09 MED ORDER — ONDANSETRON HCL 4 MG/2ML IJ SOLN
4.0000 mg | Freq: Four times a day (QID) | INTRAMUSCULAR | Status: DC | PRN
  Administered 2020-06-10 (×2): 4 mg via INTRAVENOUS
  Filled 2020-06-09 (×2): qty 2

## 2020-06-09 MED ORDER — HYDROMORPHONE HCL 1 MG/ML IJ SOLN
0.2500 mg | INTRAMUSCULAR | Status: DC | PRN
Start: 2020-06-09 — End: 2020-06-09
  Administered 2020-06-09 (×2): 0.5 mg via INTRAVENOUS

## 2020-06-09 MED ORDER — LIDOCAINE 2% (20 MG/ML) 5 ML SYRINGE
INTRAMUSCULAR | Status: AC
  Filled 2020-06-09: qty 5

## 2020-06-09 MED ORDER — MEPERIDINE HCL 25 MG/ML IJ SOLN: 6.2500 mg | INTRAMUSCULAR | Status: DC | PRN

## 2020-06-09 MED ORDER — PROPOFOL 10 MG/ML IV BOLUS
INTRAVENOUS | Status: DC | PRN
  Administered 2020-06-09: 300 mg via INTRAVENOUS

## 2020-06-09 MED ORDER — SUCCINYLCHOLINE CHLORIDE 20 MG/ML IJ SOLN
INTRAMUSCULAR | Status: DC | PRN
  Administered 2020-06-09: 200 mg via INTRAVENOUS

## 2020-06-09 MED ORDER — LIDOCAINE 2% (20 MG/ML) 5 ML SYRINGE
INTRAMUSCULAR | Status: DC | PRN
  Administered 2020-06-09: 40 mg via INTRAVENOUS

## 2020-06-09 MED ORDER — LACTATED RINGERS IV SOLN: INTRAVENOUS | Status: DC

## 2020-06-09 MED ORDER — STERILE WATER FOR IRRIGATION IR SOLN
Status: DC | PRN
  Administered 2020-06-09: 1000 mL

## 2020-06-09 MED ORDER — 0.9 % SODIUM CHLORIDE (POUR BTL) OPTIME
TOPICAL | Status: DC | PRN
  Administered 2020-06-09: 1000 mL

## 2020-06-09 MED ORDER — MIDAZOLAM HCL 2 MG/2ML IJ SOLN
INTRAMUSCULAR | Status: AC
  Filled 2020-06-09: qty 2

## 2020-06-09 MED ORDER — PHENYLEPHRINE 40 MCG/ML (10ML) SYRINGE FOR IV PUSH (FOR BLOOD PRESSURE SUPPORT)
PREFILLED_SYRINGE | INTRAVENOUS | Status: DC | PRN
  Administered 2020-06-09: 120 ug via INTRAVENOUS

## 2020-06-09 MED ORDER — SUGAMMADEX SODIUM 200 MG/2ML IV SOLN
INTRAVENOUS | Status: DC | PRN
  Administered 2020-06-09: 200 mg via INTRAVENOUS

## 2020-06-09 MED ORDER — FENTANYL CITRATE (PF) 250 MCG/5ML IJ SOLN
INTRAMUSCULAR | Status: DC | PRN
  Administered 2020-06-09: 150 ug via INTRAVENOUS
  Administered 2020-06-09 (×2): 50 ug via INTRAVENOUS

## 2020-06-09 MED ORDER — PHENYLEPHRINE HCL-NACL 10-0.9 MG/250ML-% IV SOLN: INTRAVENOUS | Status: DC | PRN

## 2020-06-09 MED ORDER — SCOPOLAMINE 1 MG/3DAYS TD PT72
1.0000 | MEDICATED_PATCH | TRANSDERMAL | Status: DC
  Administered 2020-06-09: 1.5 mg via TRANSDERMAL
  Filled 2020-06-09: qty 1

## 2020-06-09 MED ORDER — CHLORHEXIDINE GLUCONATE 0.12 % MT SOLN
15.0000 mL | Freq: Once | OROMUCOSAL | Status: AC
  Administered 2020-06-09: 15 mL via OROMUCOSAL

## 2020-06-09 MED ORDER — MIDAZOLAM HCL 5 MG/5ML IJ SOLN
INTRAMUSCULAR | Status: DC | PRN
  Administered 2020-06-09: 2 mg via INTRAVENOUS

## 2020-06-09 MED ORDER — ROCURONIUM BROMIDE 10 MG/ML (PF) SYRINGE
PREFILLED_SYRINGE | INTRAVENOUS | Status: DC | PRN
  Administered 2020-06-09 (×2): 10 mg via INTRAVENOUS
  Administered 2020-06-09: 70 mg via INTRAVENOUS
  Administered 2020-06-09: 20 mg via INTRAVENOUS

## 2020-06-09 MED ORDER — SODIUM CHLORIDE 0.9 % IV SOLN
INTRAVENOUS | Status: DC | PRN
  Administered 2020-06-09: 40 mL

## 2020-06-09 MED ORDER — ORAL CARE MOUTH RINSE: 15.0000 mL | Freq: Once | OROMUCOSAL | Status: AC

## 2020-06-09 MED ORDER — MIDAZOLAM HCL 2 MG/2ML IJ SOLN: 0.5000 mg | Freq: Once | INTRAMUSCULAR | Status: DC | PRN

## 2020-06-09 MED ORDER — DEXAMETHASONE SODIUM PHOSPHATE 10 MG/ML IJ SOLN
INTRAMUSCULAR | Status: AC
  Filled 2020-06-09: qty 1

## 2020-06-09 MED ORDER — ONDANSETRON HCL 4 MG/2ML IJ SOLN
INTRAMUSCULAR | Status: AC
  Filled 2020-06-09: qty 2

## 2020-06-09 MED ORDER — BUPIVACAINE HCL (PF) 0.25 % IJ SOLN
INTRAMUSCULAR | Status: AC
  Filled 2020-06-09: qty 30

## 2020-06-09 MED ORDER — FENTANYL CITRATE (PF) 250 MCG/5ML IJ SOLN
INTRAMUSCULAR | Status: AC
  Filled 2020-06-09: qty 5

## 2020-06-09 MED ORDER — ACETAMINOPHEN 500 MG PO TABS
1000.0000 mg | ORAL_TABLET | ORAL | Status: AC
  Administered 2020-06-09: 1000 mg via ORAL
  Filled 2020-06-09: qty 2

## 2020-06-09 MED ORDER — BUPIVACAINE LIPOSOME 1.3 % IJ SUSP
INTRAMUSCULAR | Status: AC
  Filled 2020-06-09: qty 20

## 2020-06-09 SURGICAL SUPPLY — 56 items
APPLIER CLIP 5 13 M/L LIGAMAX5 (MISCELLANEOUS)
CHLORAPREP W/TINT 26 (MISCELLANEOUS) ×2 IMPLANT
CLIP APPLIE 5 13 M/L LIGAMAX5 (MISCELLANEOUS) IMPLANT
COVER MAYO STAND STRL (DRAPES) ×2 IMPLANT
COVER SURGICAL LIGHT HANDLE (MISCELLANEOUS) ×2 IMPLANT
COVER TIP SHEARS 8 DVNC (MISCELLANEOUS) IMPLANT
COVER TIP SHEARS 8MM DA VINCI (MISCELLANEOUS)
DECANTER SPIKE VIAL GLASS SM (MISCELLANEOUS) IMPLANT
DEFOGGER SCOPE WARMER CLEARIFY (MISCELLANEOUS) ×2 IMPLANT
DERMABOND ADVANCED (GAUZE/BANDAGES/DRESSINGS) ×1
DERMABOND ADVANCED .7 DNX12 (GAUZE/BANDAGES/DRESSINGS) ×1 IMPLANT
DEVICE TROCAR PUNCTURE CLOSURE (ENDOMECHANICALS) ×2 IMPLANT
DRAIN PENROSE 0.5X18 (DRAIN) ×2 IMPLANT
DRAPE ARM DVNC X/XI (DISPOSABLE) ×4 IMPLANT
DRAPE COLUMN DVNC XI (DISPOSABLE) ×1 IMPLANT
DRAPE CV SPLIT W-CLR ANES SCRN (DRAPES) ×2 IMPLANT
DRAPE DA VINCI XI ARM (DISPOSABLE) ×4
DRAPE DA VINCI XI COLUMN (DISPOSABLE) ×1
DRAPE ORTHO SPLIT 77X108 STRL (DRAPES) ×1
DRAPE SURG ORHT 6 SPLT 77X108 (DRAPES) ×1 IMPLANT
ELECT REM PT RETURN 9FT ADLT (ELECTROSURGICAL) ×2
ELECTRODE REM PT RTRN 9FT ADLT (ELECTROSURGICAL) ×1 IMPLANT
GLOVE BIO SURGEON STRL SZ7.5 (GLOVE) ×4 IMPLANT
GOWN STRL REUS W/ TWL XL LVL3 (GOWN DISPOSABLE) ×2 IMPLANT
GOWN STRL REUS W/TWL 2XL LVL3 (GOWN DISPOSABLE) ×2 IMPLANT
GOWN STRL REUS W/TWL XL LVL3 (GOWN DISPOSABLE) ×2
KIT BASIN OR (CUSTOM PROCEDURE TRAY) ×2 IMPLANT
KIT TURNOVER KIT B (KITS) IMPLANT
MARKER SKIN DUAL TIP RULER LAB (MISCELLANEOUS) ×2 IMPLANT
MESH BIO-A 7X10 SYN MAT (Mesh General) ×2 IMPLANT
NEEDLE 22X1 1/2 (OR ONLY) (NEEDLE) ×2 IMPLANT
NEEDLE INSUFFLATION 14GA 120MM (NEEDLE) ×2 IMPLANT
OBTURATOR OPTICAL STANDARD 8MM (TROCAR) ×1
OBTURATOR OPTICAL STND 8 DVNC (TROCAR) ×1
OBTURATOR OPTICALSTD 8 DVNC (TROCAR) ×1 IMPLANT
PENCIL SMOKE EVACUATOR (MISCELLANEOUS) IMPLANT
SCISSORS LAP 5X35 DISP (ENDOMECHANICALS) IMPLANT
SEAL CANN UNIV 5-8 DVNC XI (MISCELLANEOUS) ×3 IMPLANT
SEAL XI 5MM-8MM UNIVERSAL (MISCELLANEOUS) ×3
SEALER VESSEL DA VINCI XI (MISCELLANEOUS) ×1
SEALER VESSEL EXT DVNC XI (MISCELLANEOUS) ×1 IMPLANT
SET IRRIG TUBING LAPAROSCOPIC (IRRIGATION / IRRIGATOR) ×2 IMPLANT
SET TUBE SMOKE EVAC HIGH FLOW (TUBING) ×2 IMPLANT
STAPLER CANNULA SEAL DVNC XI (STAPLE) ×1 IMPLANT
STAPLER CANNULA SEAL XI (STAPLE) ×1
STAPLER VISISTAT 35W (STAPLE) IMPLANT
STOPCOCK 4 WAY LG BORE MALE ST (IV SETS) ×2 IMPLANT
SUT ETHIBOND 0 36 GRN (SUTURE) ×4 IMPLANT
SUT ETHIBOND 2 0 SH (SUTURE)
SUT ETHIBOND 2 0 SH 36X2 (SUTURE) IMPLANT
SUT MNCRL AB 4-0 PS2 18 (SUTURE) ×2 IMPLANT
SUT SILK 0 SH 30 (SUTURE) ×6 IMPLANT
SYR 30ML SLIP (SYRINGE) IMPLANT
TRAY FOLEY MTR SLVR 16FR STAT (SET/KITS/TRAYS/PACK) IMPLANT
TRAY LAPAROSCOPIC MC (CUSTOM PROCEDURE TRAY) ×2 IMPLANT
TROCAR ADV FIXATION 5X100MM (TROCAR) ×2 IMPLANT

## 2020-06-09 NOTE — Anesthesia Procedure Notes (Signed)
Procedure Name: Intubation Date/Time: 06/09/2020 9:37 AM Performed by: Quentin Ore, CRNA Pre-anesthesia Checklist: Patient identified, Emergency Drugs available, Suction available and Patient being monitored Patient Re-evaluated:Patient Re-evaluated prior to induction Oxygen Delivery Method: Circle system utilized Preoxygenation: Pre-oxygenation with 100% oxygen Induction Type: IV induction and Rapid sequence Laryngoscope Size: Glidescope and 4 Tube type: Oral Tube size: 7.5 mm Number of attempts: 1 Airway Equipment and Method: Stylet and Video-laryngoscopy Placement Confirmation: ETT inserted through vocal cords under direct vision,  positive ETCO2 and breath sounds checked- equal and bilateral Secured at: 22 cm Tube secured with: Tape Dental Injury: Teeth and Oropharynx as per pre-operative assessment

## 2020-06-09 NOTE — H&P (Signed)
History of Present Illness The patient is a 25 year old male presenting for a post-operative visit. Patient is a 25-year-old male, who comes in secondary to history of chronic GERD. Patient had a previous Nissen fundoplication at age 13. This approximately 11 years ago. There is no mesh placed at that time. Patient had continued reflux. He's been studied several times. Dr. Karki has done an upper endoscopy which revealed a type III sliding hiatal hernia. Patient underwent a CT scan which revealed no widening of the hiatus.  Patient does state that he has significant reflux. He states that he wakes up with water brash, sore mouth, sleep on two pillows secondary to wheezing.   Patient's had a previous laparoscopic cholecystectomy. He's not manometry.   Allergies No Known Allergies  [04/22/2020]: Allergies Reconciled   Medication History Humira Pen (40MG/0.4ML Pen-inj Kit, Subcutaneous) Active. Pantoprazole Sodium (40MG Tablet DR, Oral) Active. Medications Reconciled    Review of Systems General Not Present- Appetite Loss, Chills, Fatigue, Fever, Night Sweats, Weight Gain and Weight Loss. Skin Not Present- Change in Wart/Mole, Dryness, Hives, Jaundice, New Lesions, Non-Healing Wounds, Rash and Ulcer. HEENT Present- Wears glasses/contact lenses. Not Present- Earache, Hearing Loss, Hoarseness, Nose Bleed, Oral Ulcers, Ringing in the Ears, Seasonal Allergies, Sinus Pain, Sore Throat, Visual Disturbances and Yellow Eyes. Breast Not Present- Breast Mass, Breast Pain, Nipple Discharge and Skin Changes. Cardiovascular Not Present- Chest Pain, Difficulty Breathing Lying Down, Leg Cramps, Palpitations, Rapid Heart Rate, Shortness of Breath and Swelling of Extremities. Gastrointestinal Not Present- Abdominal Pain, Bloating, Bloody Stool, Change in Bowel Habits, Chronic diarrhea, Constipation, Difficulty Swallowing, Excessive gas, Gets full quickly at meals, Hemorrhoids,  Indigestion, Nausea, Rectal Pain and Vomiting. Musculoskeletal Not Present- Back Pain, Joint Pain, Joint Stiffness, Muscle Pain, Muscle Weakness and Swelling of Extremities. Neurological Not Present- Decreased Memory, Fainting, Headaches, Numbness, Seizures, Tingling, Tremor, Trouble walking and Weakness. Psychiatric Not Present- Anxiety, Bipolar, Change in Sleep Pattern, Depression, Fearful and Frequent crying. Endocrine Not Present- Cold Intolerance, Excessive Hunger, Hair Changes, Heat Intolerance and New Diabetes. Hematology Not Present- Blood Thinners, Easy Bruising, Excessive bleeding, Gland problems, HIV and Persistent Infections.  BP 124/75   Pulse 80   Temp 98.7 F (37.1 C) (Oral)   Resp 18   Ht 6' 3" (1.905 m)   Wt (!) 154.2 kg   SpO2 100%   BMI 42.50 kg/m     Physical Exam The physical exam findings are as follows: Note: Constitutional: No acute distress, conversant, appears stated age  Eyes: Anicteric sclerae, moist conjunctiva, no lid lag  Neck: No thyromegaly, trachea midline, no cervical lymphadenopathy  Lungs: Clear to auscultation biilaterally, normal respiratory effot  Cardiovascular: regular rate & rhythm, no murmurs, no peripheal edema, pedal pulses 2+  GI: Soft, no masses or hepatosplenomegaly, non-tender to palpation  MSK: Normal gait, no clubbing cyanosis, edema  Skin: No rashes, palpation reveals normal skin turgor  Psychiatric: Appropriate judgment and insight, oriented to person, place, and time  General Mental Status-Alert. General Appearance-Consistent with stated age. Hydration-Well hydrated. Voice-Normal.  Abdomen Inspection Inspection of the abdomen reveals - Incision healing well without signs of infections or seroma.    Assessment & Plan SLIDING HIATAL HERNIA (K44.9) Impression: Patient is a 25-year-old male, with recurrent sliding type III hiatal hernia. 1. Will proceed to the operating room for a  robotic hiatal hernia repair with mesh and fundoplication  2. Discussed with patient the risks and benefits of the procedure to include but not limited to: Infection, bleeding, damage   to structures, possible pneumothorax, possible recurrence. The patient voiced understanding and wishes to proceed.

## 2020-06-09 NOTE — Transfer of Care (Signed)
Immediate Anesthesia Transfer of Care Note  Patient: Devin Smith  Procedure(s) Performed: XI ROBOTIC ASSISTED HIATAL HERNIA REPAIR WITH FUNDOPLICATION WITH MESH (N/A Abdomen) XI ROBOTIC ASSISTED LAPAROSCOPIC NISSEN FUNDOPLICATION (N/A Abdomen) INSERTION OF MESH (N/A Abdomen)  Patient Location: PACU  Anesthesia Type:General  Level of Consciousness: awake, alert  and oriented  Airway & Oxygen Therapy: Patient Spontanous Breathing  Post-op Assessment: Report given to RN, Post -op Vital signs reviewed and stable and Patient moving all extremities X 4  Post vital signs: Reviewed and stable  Last Vitals:  Vitals Value Taken Time  BP 134/75 06/09/20 1147  Temp    Pulse 86 06/09/20 1151  Resp 18 06/09/20 1151  SpO2 92 % 06/09/20 1151  Vitals shown include unvalidated device data.  Last Pain:  Vitals:   06/09/20 0851  TempSrc:   PainSc: 0-No pain         Complications: No complications documented.

## 2020-06-09 NOTE — Anesthesia Preprocedure Evaluation (Addendum)
Anesthesia Evaluation  Patient identified by MRN, date of birth, ID band Patient awake    Reviewed: Allergy & Precautions, NPO status , Patient's Chart, lab work & pertinent test results  History of Anesthesia Complications Negative for: history of anesthetic complications  Airway Mallampati: IV  TM Distance: >3 FB Neck ROM: Full    Dental  (+) Dental Advisory Given   Pulmonary asthma (hasn't needed inhaler in over a year) ,  06/06/2020 SARS coronavirus NEG   breath sounds clear to auscultation       Cardiovascular negative cardio ROS   Rhythm:Regular Rate:Normal     Neuro/Psych negative neurological ROS     GI/Hepatic Neg liver ROS, GERD  Poorly Controlled,  Endo/Other  Morbid obesity  Renal/GU negative Renal ROS     Musculoskeletal   Abdominal (+) + obese,   Peds  Hematology negative hematology ROS (+)   Anesthesia Other Findings   Reproductive/Obstetrics                            Anesthesia Physical Anesthesia Plan  ASA: II  Anesthesia Plan: General   Post-op Pain Management:    Induction: Intravenous and Rapid sequence  PONV Risk Score and Plan: 2 and Ondansetron, Dexamethasone and Treatment may vary due to age or medical condition  Airway Management Planned: Oral ETT and Video Laryngoscope Planned  Additional Equipment: None  Intra-op Plan:   Post-operative Plan: Extubation in OR  Informed Consent: I have reviewed the patients History and Physical, chart, labs and discussed the procedure including the risks, benefits and alternatives for the proposed anesthesia with the patient or authorized representative who has indicated his/her understanding and acceptance.     Dental advisory given  Plan Discussed with: CRNA and Surgeon  Anesthesia Plan Comments:        Anesthesia Quick Evaluation

## 2020-06-09 NOTE — Anesthesia Postprocedure Evaluation (Signed)
Anesthesia Post Note  Patient: Alaa Eyerman  Procedure(s) Performed: XI ROBOTIC ASSISTED HIATAL HERNIA REPAIR WITH FUNDOPLICATION WITH MESH (N/A Abdomen) XI ROBOTIC ASSISTED LAPAROSCOPIC NISSEN FUNDOPLICATION (N/A Abdomen) INSERTION OF MESH (N/A Abdomen)     Patient location during evaluation: PACU Anesthesia Type: General Level of consciousness: awake and alert, patient cooperative and oriented Pain management: pain level controlled Vital Signs Assessment: post-procedure vital signs reviewed and stable Respiratory status: spontaneous breathing, nonlabored ventilation and respiratory function stable Cardiovascular status: blood pressure returned to baseline and stable Postop Assessment: no apparent nausea or vomiting Anesthetic complications: no   No complications documented.  Last Vitals:  Vitals:   06/09/20 1245 06/09/20 1300  BP: 126/64 119/64  Pulse: 83 78  Resp: 18 18  Temp:  36.7 C  SpO2: 95% 94%    Last Pain:  Vitals:   06/09/20 1200  TempSrc:   PainSc: Asleep                 Roanna Reaves,E. Rorey Bisson

## 2020-06-09 NOTE — Op Note (Signed)
06/09/2020  11:26 AM  PATIENT:  Devin Smith  25 y.o. male  PRE-OPERATIVE DIAGNOSIS:  RECURRENT HIATAL HERNIA  POST-OPERATIVE DIAGNOSIS:  RECURRENT HIATAL HERNIA  PROCEDURE:  Procedure(s): XI ROBOTIC ASSISTED HIATAL HERNIA REPAIR WITH TOUPET  FUNDOPLICATION WITH MESH (N/A)  SURGEON:  Surgeon(s) and Role:    * Axel Filler, MD - Primary  ASSISTANTS: Berenda Morale, RNFA   ANESTHESIA:   local and general  EBL:  minimal   BLOOD ADMINISTERED:none  DRAINS: none   LOCAL MEDICATIONS USED:  BUPIVICAINE  and OTHER exparil  SPECIMEN:  No Specimen  DISPOSITION OF SPECIMEN:  N/A  COUNTS:  YES  TOURNIQUET:  * No tourniquets in log *  DICTATION: .Dragon Dictation The patient was taken back to the operating room and placed in the supine position with bilateral SCDs in place. The patient was prepped and draped in the usual sterile fashion. After appropriate antibiotics were confirmed a timeout was called and all facts were verified.   A Veress needle technique was used to insufflate the abdomen to 15 mm of mercury the paramedian stab incision. Subsequent to this an 8 mm trocar was introduced as was a 8 millimeter camera. At this time the subsequent robotic trochars x3, were then placed adjacent to this trocar approximately 8-10 cm away. Each trocar was inserted under direct visualization, there were total of 4 trochars. The assistant trocar was then placed in the right lower quadrant under direct visualization. The Nathanson retractor was then visualized inserted into the abdomen and the incision just to the left of the falciform ligament. This was then placed to retract the liver appropriately. At this time the patient was positioned in reverse Trendelenburg.   At this time the robot patient cart was brought to the bedside and placed in good position and the arms were docked to the trochars appropriately. At this time I proceeded to incised the gastrohepatic ligament.  At this time I  proceeded to mobilize the stomach inferiorly and visualize the right crus. There was some minimal scar tissue at the hiatus.  The peritoneum over the right crus was incised and right crus was identified. I proceeded to dissect this inferiorly until the left crus was seen joining the right crus. Once the right crus was adequately dissected we turned our to the left crus which was dissected away. This required traction of the stomach to the right side. Once this was visualized we then proceeded to circumferentially dissect the esophagus away from the surrounding tissue. The anterior and posterior vagus was seen along the esophagus at the GE junction.  These were both preserved throughout the entire case. At this time a Penrose drain was placed around the esophagus to help with retraction. At this time the phrenoesophageal fat pad was dissected away from the esophagus. There was a no hiatal hernia seen. I mobilized the esophagus cephalad approximately 4-5 cm, clearing away the surrounding tissue.   There was some minimal scar tissue in the mediastinum.   At this time we turned our attention to the greater curvature the stomach and the omentum was mobilized using the robotic vessel sealer. This was taken up to the greater curvature to the hiatus. This mobilized the entire greater curvature to allow mobilization and the wrap. I then proceeded to bring the greater curvature the stomach posterior to the esophagus, and a shoeshine technique was used to evaluate the mobilization of the greater curvature.   At this time I proceeded to close the hiatus using interrupted  0 Ethibonds x 2. This brought together the hiatal closure without undue stricture to the esophagus.   A piece of Gore Bio A hiatal mesh was placed over the hiatal closure and sutured to the crus using 0 Ethibond sutures x 4.  At this time the greater curvature was brought around the esophagus and sutured using 0 silk sutures interrupted fashion  approximately 1 cm apart x3 on each side of the esophagus in a Toupet fashion. A left collar stitch was then used to gastropexy the stomach from the wrap to the diaphragm just lateral to the left crus as.  A second collar stitch was placed from the wrap to the right crus. The wrap lay at approximately 11:00 on its own with undue tension.  The wrap lay loose with no strangulation of the esophagus.  At this time the robot was undocked. The liver trocar was removed. At this time insufflation was evacuated. Skin was reapproximated for Monocryl subcuticular fashion. The skin was then dressed with Dermabond. The patient tolerated the procedure well and was taken to the recovery room in stable condition.    PLAN OF CARE: Admit for overnight observation  PATIENT DISPOSITION:  PACU - hemodynamically stable.   Delay start of Pharmacological VTE agent (>24hrs) due to surgical blood loss or risk of bleeding: not applicable

## 2020-06-09 NOTE — Discharge Instructions (Signed)
EATING AFTER YOUR ESOPHAGEAL SURGERY (Stomach Fundoplication, Hiatal Hernia repair, Achalasia surgery, etc)  ######################################################################  EAT Start with a pureed / full liquid diet (see below) Gradually transition to a high fiber diet with a fiber supplement over the next month after discharge.    WALK Walk an hour a day.  Control your pain to do that.    CONTROL PAIN Control pain so that you can walk, sleep, tolerate sneezing/coughing, go up/down stairs.  HAVE A BOWEL MOVEMENT DAILY Keep your bowels regular to avoid problems.  OK to try a laxative to override constipation.  OK to use an antidairrheal to slow down diarrhea.  Call if not better after 2 tries  CALL IF YOU HAVE PROBLEMS/CONCERNS Call if you are still struggling despite following these instructions. Call if you have concerns not answered by these instructions  ######################################################################   After your esophageal surgery, expect some sticking with swallowing over the next 1-2 months.    If food sticks when you eat, it is called "dysphagia".  This is due to swelling around your esophagus at the wrap & hiatal diaphragm repair.  It will gradually ease off over the next few months.  To help you through this temporary phase, we start you out on a pureed (blenderized) diet.  Your first meal in the hospital was thin liquids.  You should have been given a pureed diet by the time you left the hospital.  We ask patients to stay on a pureed diet for the first 2-3 weeks to avoid anything getting "stuck" near your recent surgery.  Don't be alarmed if your ability to swallow doesn't progress according to this plan.  Everyone is different and some diets can advance more or less quickly.    It is often helpful to crush your medications or split them as they can sometimes stick, especially the first week or so.   Some BASIC RULES to follow  are:  Maintain an upright position whenever eating or drinking.  Take small bites - just a teaspoon size bite at a time.  Eat slowly.  It may also help to eat only one food at a time.  Consider nibbling through smaller, more frequent meals & avoid the urge to eat BIG meals  Do not push through feelings of fullness, nausea, or bloatedness  Do not mix solid foods and liquids in the same mouthful  Try not to "wash foods down" with large gulps of liquids.  Avoid carbonated (bubbly/fizzy) drinks.    Avoid foods that make you feel gassy or bloated.  Start with bland foods first.  Wait on trying greasy, fried, or spicy meals until you are tolerating more bland solids well.  Understand that it will be hard to burp and belch at first.  This gradually improves with time.  Expect to be more gassy/flatulent/bloated initially.  Walking will help your body manage it better.  Consider using medications for bloating that contain simethicone such as  Maalox or Gas-X   Consider crushing her medications, especially smaller pills.  The ability to swallow pills should get easier after a few weeks  Eat in a relaxed atmosphere & minimize distractions.  Avoid talking while eating.    Do not use straws.  Following each meal, sit in an upright position (90 degree angle) for 60 to 90 minutes.  Going for a short walk can help as well  If food does stick, don't panic.  Try to relax and let the food pass on its own.    Sipping WARM LIQUID such as strong hot black tea can also help slide it down.   Be gradual in changes & use common sense:  -If you easily tolerating a certain "level" of foods, advance to the next level gradually -If you are having trouble swallowing a particular food, then avoid it.   -If food is sticking when you advance your diet, go back to thinner previous diet (the lower LEVEL) for 1-2 days.  LEVEL 1 = PUREED DIET  Do for the first 2 WEEKS AFTER SURGERY  -Foods in this group are  pureed or blenderized to a smooth, mashed potato-like consistency.  -If necessary, the pureed foods can keep their shape with the addition of a thickening agent.   -Meat should be pureed to a smooth, pasty consistency.  Hot broth or gravy may be added to the pureed meat, approximately 1 oz. of liquid per 3 oz. serving of meat. -CAUTION:  If any foods do not puree into a smooth consistency, swallowing will be more difficult.  (For example, nuts or seeds sometimes do not blend well.)  Hot Foods Cold Foods  Pureed scrambled eggs and cheese Pureed cottage cheese  Baby cereals Thickened juices and nectars  Thinned cooked cereals (no lumps) Thickened milk or eggnog  Pureed French toast or pancakes Ensure  Mashed potatoes Ice cream  Pureed parsley, au gratin, scalloped potatoes, candied sweet potatoes Fruit or Italian ice, sherbet  Pureed buttered or alfredo noodles Plain yogurt  Pureed vegetables (no corn or peas) Instant breakfast  Pureed soups and creamed soups Smooth pudding, mousse, custard  Pureed scalloped apples Whipped gelatin  Gravies Sugar, syrup, honey, jelly  Sauces, cheese, tomato, barbecue, white, creamed Cream  Any baby food Creamer  Alcohol in moderation (not beer or champagne) Margarine  Coffee or tea Mayonnaise   Ketchup, mustard   Apple sauce   SAMPLE MENU:  PUREED DIET Breakfast Lunch Dinner   Orange juice, 1/2 cup  Cream of wheat, 1/2 cup  Pineapple juice, 1/2 cup  Pureed turkey, barley soup, 3/4 cup  Pureed Hawaiian chicken, 3 oz   Scrambled eggs, mashed or blended with cheese, 1/2 cup  Tea or coffee, 1 cup   Whole milk, 1 cup   Non-dairy creamer, 2 Tbsp.  Mashed potatoes, 1/2 cup  Pureed cooled broccoli, 1/2 cup  Apple sauce, 1/2 cup  Coffee or tea  Mashed potatoes, 1/2 cup  Pureed spinach, 1/2 cup  Frozen yogurt, 1/2 cup  Tea or coffee      LEVEL 2 = SOFT DIET  After your first 2 weeks, you can advance to a soft diet.   Keep on this  diet until everything goes down easily.  Hot Foods Cold Foods  White fish Cottage cheese  Stuffed fish Junior baby fruit  Baby food meals Semi thickened juices  Minced soft cooked, scrambled, poached eggs nectars  Souffle & omelets Ripe mashed bananas  Cooked cereals Canned fruit, pineapple sauce, milk  potatoes Milkshake  Buttered or Alfredo noodles Custard  Cooked cooled vegetable Puddings, including tapioca  Sherbet Yogurt  Vegetable soup or alphabet soup Fruit ice, Italian ice  Gravies Whipped gelatin  Sugar, syrup, honey, jelly Junior baby desserts  Sauces:  Cheese, creamed, barbecue, tomato, white Cream  Coffee or tea Margarine   SAMPLE MENU:  LEVEL 2 Breakfast Lunch Dinner   Orange juice, 1/2 cup  Oatmeal, 1/2 cup  Scrambled eggs with cheese, 1/2 cup  Decaffeinated tea, 1 cup  Whole milk, 1 cup    Non-dairy creamer, 2 Tbsp  Pineapple juice, 1/2 cup  Minced beef, 3 oz  Gravy, 2 Tbsp  Mashed potatoes, 1/2 cup  Minced fresh broccoli, 1/2 cup  Applesauce, 1/2 cup  Coffee, 1 cup  Turkey, barley soup, 3/4 cup  Minced Hawaiian chicken, 3 oz  Mashed potatoes, 1/2 cup  Cooked spinach, 1/2 cup  Frozen yogurt, 1/2 cup  Non-dairy creamer, 2 Tbsp      LEVEL 3 = CHOPPED DIET  -After all the foods in level 2 (soft diet) are passing through well you should advance up to more chopped foods.  -It is still important to cut these foods into small pieces and eat slowly.  Hot Foods Cold Foods  Poultry Cottage cheese  Chopped Swedish meatballs Yogurt  Meat salads (ground or flaked meat) Milk  Flaked fish (tuna) Milkshakes  Poached or scrambled eggs Soft, cold, dry cereal  Souffles and omelets Fruit juices or nectars  Cooked cereals Chopped canned fruit  Chopped French toast or pancakes Canned fruit cocktail  Noodles or pasta (no rice) Pudding, mousse, custard  Cooked vegetables (no frozen peas, corn, or mixed vegetables) Green salad  Canned small sweet peas  Ice cream  Creamed soup or vegetable soup Fruit ice, Italian ice  Pureed vegetable soup or alphabet soup Non-dairy creamer  Ground scalloped apples Margarine  Gravies Mayonnaise  Sauces:  Cheese, creamed, barbecue, tomato, white Ketchup  Coffee or tea Mustard   SAMPLE MENU:  LEVEL 3 Breakfast Lunch Dinner   Orange juice, 1/2 cup  Oatmeal, 1/2 cup  Scrambled eggs with cheese, 1/2 cup  Decaffeinated tea, 1 cup  Whole milk, 1 cup  Non-dairy creamer, 2 Tbsp  Ketchup, 1 Tbsp  Margarine, 1 tsp  Salt, 1/4 tsp  Sugar, 2 tsp  Pineapple juice, 1/2 cup  Ground beef, 3 oz  Gravy, 2 Tbsp  Mashed potatoes, 1/2 cup  Cooked spinach, 1/2 cup  Applesauce, 1/2 cup  Decaffeinated coffee  Whole milk  Non-dairy creamer, 2 Tbsp  Margarine, 1 tsp  Salt, 1/4 tsp  Pureed turkey, barley soup, 3/4 cup  Barbecue chicken, 3 oz  Mashed potatoes, 1/2 cup  Ground fresh broccoli, 1/2 cup  Frozen yogurt, 1/2 cup  Decaffeinated tea, 1 cup  Non-dairy creamer, 2 Tbsp  Margarine, 1 tsp  Salt, 1/4 tsp  Sugar, 1 tsp    LEVEL 4:  REGULAR FOODS  -Foods in this group are soft, moist, regularly textured foods.   -This level includes meat and breads, which tend to be the hardest things to swallow.   -Eat very slowly, chew well and continue to avoid carbonated drinks. -most people are at this level in 4-6 weeks  Hot Foods Cold Foods  Baked fish or skinned Soft cheeses - cottage cheese  Souffles and omelets Cream cheese  Eggs Yogurt  Stuffed shells Milk  Spaghetti with meat sauce Milkshakes  Cooked cereal Cold dry cereals (no nuts, dried fruit, coconut)  French toast or pancakes Crackers  Buttered toast Fruit juices or nectars  Noodles or pasta (no rice) Canned fruit  Potatoes (all types) Ripe bananas  Soft, cooked vegetables (no corn, lima, or baked beans) Peeled, ripe, fresh fruit  Creamed soups or vegetable soup Cakes (no nuts, dried fruit, coconut)  Canned chicken  noodle soup Plain doughnuts  Gravies Ice cream  Bacon dressing Pudding, mousse, custard  Sauces:  Cheese, creamed, barbecue, tomato, white Fruit ice, Italian ice, sherbet  Decaffeinated tea or coffee Whipped gelatin  Pork chops Regular gelatin     Canned fruited gelatin molds   Sugar, syrup, honey, jam, jelly   Cream   Non-dairy   Margarine   Oil   Mayonnaise   Ketchup   Mustard   TROUBLESHOOTING IRREGULAR BOWELS  1) Avoid extremes of bowel movements (no bad constipation/diarrhea)  2) Miralax 17gm mixed in 8oz. water or juice-daily. May use BID as needed.  3) Gas-x,Phazyme, etc. as needed for gas & bloating.  4) Soft,bland diet. No spicy,greasy,fried foods.  5) Prilosec over-the-counter as needed  6) May hold gluten/wheat products from diet to see if symptoms improve.  7) May try probiotics (Align, Activa, etc) to help calm the bowels down  7) If symptoms become worse call back immediately.    If you have any questions please call our office at [facility]: [phone].  

## 2020-06-10 ENCOUNTER — Encounter (HOSPITAL_COMMUNITY): Payer: Self-pay | Admitting: General Surgery

## 2020-06-10 ENCOUNTER — Observation Stay (HOSPITAL_COMMUNITY): Payer: 59

## 2020-06-10 DIAGNOSIS — K449 Diaphragmatic hernia without obstruction or gangrene: Secondary | ICD-10-CM | POA: Diagnosis not present

## 2020-06-10 LAB — BASIC METABOLIC PANEL
Anion gap: 10 (ref 5–15)
BUN: 9 mg/dL (ref 6–20)
CO2: 26 mmol/L (ref 22–32)
Calcium: 9.4 mg/dL (ref 8.9–10.3)
Chloride: 100 mmol/L (ref 98–111)
Creatinine, Ser: 1.22 mg/dL (ref 0.61–1.24)
GFR, Estimated: >60 mL/min (ref >60)
Glucose, Bld: 108 mg/dL — ABNORMAL HIGH (ref 70–99)
Potassium: 4 mmol/L (ref 3.5–5.1)
Sodium: 136 mmol/L (ref 135–145)

## 2020-06-10 MED ORDER — IOHEXOL 300 MG/ML  SOLN
150.0000 mL | Freq: Once | INTRAMUSCULAR | Status: AC | PRN
  Administered 2020-06-10: 75 mL via ORAL

## 2020-06-10 NOTE — Progress Notes (Signed)
1 Day Post-Op   Subjective/Chief Complaint: PT doing well this AM Some soreness   Objective: Vital signs in last 24 hours: Temp:  [97.5 F (36.4 C)-98.7 F (37.1 C)] 97.9 F (36.6 C) (03/30 0443) Pulse Rate:  [78-96] 79 (03/30 0443) Resp:  [17-23] 18 (03/30 0443) BP: (112-134)/(59-85) 114/85 (03/30 0443) SpO2:  [93 %-100 %] 95 % (03/30 0104) Weight:  [154.2 kg] 154.2 kg (03/29 0823) Last BM Date: 06/08/20  Intake/Output from previous day: 03/29 0701 - 03/30 0700 In: 2536.6 [I.V.:2486.6; IV Piggyback:50] Out: 25 [Blood:25] Intake/Output this shift: Total I/O In: -  Out: 1000 [Urine:1000]  General appearance: alert and cooperative GI: soft, non-tender; bowel sounds normal; no masses,  no organomegaly and inc cdi  Lab Results:  Recent Labs    06/09/20 0922  WBC 7.4  HGB 14.7  HCT 41.1  PLT 388   BMET Recent Labs    06/10/20 0250  NA 136  K 4.0  CL 100  CO2 26  GLUCOSE 108*  BUN 9  CREATININE 1.22  CALCIUM 9.4  Anti-infectives: Anti-infectives (From admission, onward)   Start     Dose/Rate Route Frequency Ordered Stop   06/09/20 0945  ceFAZolin (ANCEF) 3 g in dextrose 5 % 50 mL IVPB        3 g 100 mL/hr over 30 Minutes Intravenous On call to O.R. 06/08/20 1225 06/09/20 1000      Assessment/Plan: s/p Procedure(s): XI ROBOTIC ASSISTED HIATAL HERNIA REPAIR WITH FUNDOPLICATION WITH MESH (N/A) XI ROBOTIC ASSISTED LAPAROSCOPIC NISSEN FUNDOPLICATION (N/A) INSERTION OF MESH (N/A) POD1 Await esophagram if no leak, OK to start Fulls this AM Ambulate Home later today   LOS: 0 days    Axel Filler 06/10/2020

## 2020-06-10 NOTE — Progress Notes (Signed)
Patient returned from procedure at 1000 this morning. Patient is alert but sleepy, pain meds given for management. Thins started and patient ate 1/4 of chicken broth provided, and one pudding cup. Sister and daughter here in morning but have since left; patient states he is not feeling well and would prefer to stay another night, MD Derrell Lolling notified.

## 2020-06-10 NOTE — Plan of Care (Addendum)
Nutrition Education Note:  RD consulted for nutrition education regarding diet instructions s/p Nissen (pureed diet for 2 weeks post-op).    RD provided "Diet After Nissen Procedure" handout. Reviewed patient's dietary recall. Provided examples on ways to modify foods on pureed diet. Encouraged pt to blend foods using a blender or food processor and strain to obtain desired consistency (no lumps). Also encouraged use of commercial pureed foods and oral nutritional supplements such as Ensure to provide adequate calories and protein. Discussed how to modify recipes to add calories and protein.   Pt on the sleepier side from surgery and in a bit of pain. Spoke primarily to family (mother and sister). Mother reports that she is familiar with this diet as she had gastric bypass surgery 10 months ago and pt has had Nissen procedure before when he was 46.   RD discussed why it is important for patient to adhere to diet recommendations and discussed possibility of diet advancement upon MD follow-up. Teach back method used.   Expect good compliance.   Body mass index is 32.78 kg/m. Pt meets criteria for Obesity Class 3 based on current BMI.   Current diet order is full liquid. Labs and medications reviewed.   No further nutrition interventions warranted at this time. RD contact information provided. If additional nutrition issues arise, please re-consult RD.    Vertell Limber, RD, LDN Registered Dietitian After Hours/Weekend Pager # in [place]

## 2020-06-11 DIAGNOSIS — K449 Diaphragmatic hernia without obstruction or gangrene: Secondary | ICD-10-CM | POA: Diagnosis not present

## 2020-06-11 MED ORDER — HYDROCODONE-ACETAMINOPHEN 7.5-325 MG/15ML PO SOLN: 15.0000 mL | Freq: Four times a day (QID) | ORAL | 0 refills | Status: AC | PRN

## 2020-06-11 NOTE — Progress Notes (Signed)
Patient's pain managed adequately with prn dilaudid 1mg . Discharge order acknowledged; waiting for patient to ambulate; then will continue with discharge today 06/11/2020.

## 2020-06-11 NOTE — Discharge Summary (Signed)
Physician Discharge Summary  Patient ID: Devin Smith MRN: [id number] DOB/AGE: Apr 21, 1995 25 y.o.  Admit date: 06/09/2020 Discharge date: 06/11/2020  Admission Diagnoses:s/p redo HHR with mesh and fundoplication  Discharge Diagnoses:  Active Problems:   S/P Nissen fundoplication (without gastrostomy tube) procedure   Discharged Condition: good  Hospital Course: Pt was admitted post op He had a esophagram POD 1 which showed no leak.  He was started on a liq diet which he tolerated well.  He was was ambulating well on his own and had good pain control.  He was afeb and deemed stable for DC and Dc'd home.   Consults: None  Significant Diagnostic Studies: esophagram as above  Treatments: surgery: as above  Discharge Exam: Blood pressure (!) 128/56, pulse 97, temperature 99 F (37.2 C), temperature source Oral, resp. rate 18, height 6\' 3"  (1.905 m), weight (!) 154.2 kg, SpO2 (!) 89 %. General appearance: alert and cooperative GI: soft, non-tender; bowel sounds normal; no masses,  no organomegaly and inc cdi  Disposition: Discharge disposition: 01-Home or Self Care       Discharge Instructions    Diet - low sodium heart healthy   Complete by: As directed    Increase activity slowly   Complete by: As directed      Allergies as of 06/11/2020   No Known Allergies     Medication List    TAKE these medications   acetaminophen 325 MG tablet Commonly known as: TYLENOL Take 650 mg by mouth every 6 (six) hours as needed (pain).   Humira 40 MG/0.4ML Pskt Generic drug: Adalimumab Inject 40 mg into the skin every 14 (fourteen) days.   HYDROcodone-acetaminophen 7.5-325 mg/15 ml solution Commonly known as: HYCET Take 15 mLs by mouth 4 (four) times daily as needed for moderate pain.   ibuprofen 200 MG tablet Commonly known as: ADVIL Take 600 mg by mouth every 6 (six) hours as needed for headache or moderate pain.   ondansetron 4 MG disintegrating tablet Commonly known  as: ZOFRAN-ODT Take 4 mg by mouth 3 (three) times daily as needed.   pantoprazole 40 MG tablet Commonly known as: PROTONIX Take 40 mg by mouth 2 (two) times daily.   sucralfate 1 GM/10ML suspension Commonly known as: CARAFATE Take 1 g by mouth in the morning and at bedtime.       Follow-up Information    07-12-1976, MD. Schedule an appointment as soon as possible for a visit in 2 weeks.   Specialty: General Surgery Why: Post op visit Contact information: [address] [place] [phone]               Signed: [phone] 06/11/2020, 9:05 AM

## 2021-02-10 ENCOUNTER — Encounter (HOSPITAL_COMMUNITY): Payer: Self-pay | Admitting: *Deleted

## 2021-02-10 ENCOUNTER — Emergency Department (HOSPITAL_COMMUNITY)
Admission: EM | Admit: 2021-02-10 | Discharge: 2021-02-10 | Disposition: A | Discharge status: Home / Self Care | Payer: Commercial Managed Care - PPO | Attending: Emergency Medicine | Admitting: Emergency Medicine

## 2021-02-10 ENCOUNTER — Other Ambulatory Visit: Payer: Self-pay

## 2021-02-10 DIAGNOSIS — M545 Low back pain, unspecified: Secondary | ICD-10-CM | POA: Diagnosis present

## 2021-02-10 DIAGNOSIS — J45909 Unspecified asthma, uncomplicated: Secondary | ICD-10-CM | POA: Diagnosis not present

## 2021-02-10 MED ORDER — KETOROLAC TROMETHAMINE 60 MG/2ML IM SOLN
60.0000 mg | Freq: Once | INTRAMUSCULAR | Status: AC
  Administered 2021-02-10: 60 mg via INTRAMUSCULAR
  Filled 2021-02-10: qty 2

## 2021-02-10 MED ORDER — METHOCARBAMOL 500 MG PO TABS: 500.0000 mg | ORAL_TABLET | Freq: Two times a day (BID) | ORAL | 0 refills | Status: DC

## 2021-02-10 MED ORDER — NAPROXEN 375 MG PO TABS: 375.0000 mg | ORAL_TABLET | Freq: Two times a day (BID) | ORAL | 0 refills | Status: DC

## 2021-02-10 NOTE — ED Provider Notes (Signed)
[facility] EMERGENCY DEPARTMENT Provider Note   CSN: [id number] Arrival date & time: 02/10/21  1701     History Chief Complaint  Patient presents with   Back Pain    Devin Smith is a 25 y.o. male.  Patient with complaint of low back pain x 2 weeks. No known injury. Pain midline, occasionally radiates upward and/or to left buttock. No urinary symptoms. No loss of bowel or bladder control.  The history is provided by the patient. No language interpreter was used.  Back Pain Location:  Lumbar spine Quality:  Shooting and stabbing Pain severity:  Moderate Pain is:  Same all the time Onset quality:  Gradual Duration:  2 weeks Timing:  Intermittent Progression:  Waxing and waning Chronicity:  New Associated symptoms: no abdominal pain, no bladder incontinence, no bowel incontinence, no dysuria, no fever, no numbness, no paresthesias and no perianal numbness       Past Medical History:  Diagnosis Date   Asthma    Cardiomegaly    Chest wall pain, chronic    Family history of kidney cancer    Family history of lung cancer    Family history of prostate cancer    Family history of stomach cancer    GERD (gastroesophageal reflux disease)    Pectus carinatum     Patient Active Problem List   Diagnosis Date Noted   S/P Nissen fundoplication (without gastrostomy tube) procedure 06/09/2020   Family history of stomach cancer    Family history of prostate cancer    Family history of kidney cancer    Family history of lung cancer    Diarrhea 05/01/2019   Rectal bleeding 12/19/2018   Family history of colon cancer 12/19/2018   Cardiomegaly 05/22/2015    Past Surgical History:  Procedure Laterality Date    XI ROBOTIC ASSISTED HIATAL HERNIA REPAIR WITH FUNDOPLICATION WITH MESH (N/A Abdomen)  06/09/2020   BIOPSY  12/31/2018   Procedure: BIOPSY;  Surgeon: West Bali, MD;  [facility]: [facility];  Service: Endoscopy;;  Ileal, Right , left colon biopsies     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   CHOLECYSTECTOMY     COLONOSCOPY WITH PROPOFOL N/A 12/31/2018   Procedure: COLONOSCOPY WITH PROPOFOL;  Surgeon: West Bali, MD;  Location: [facility];  Service: Endoscopy;  Laterality: N/A;  10:15am   ESOPHAGEAL MANOMETRY N/A 05/11/2020   Procedure: ESOPHAGEAL MANOMETRY (EM);  Surgeon: Kerin Salen, MD;  Location: [facility];  Service: Gastroenterology;  Laterality: N/A;   HERNIA REPAIR     INSERTION OF MESH N/A 06/09/2020   Procedure: INSERTION OF MESH;  Surgeon: Axel Filler, MD;  Location: [facility] OR;  Service: General;  Laterality: N/A;   Ravitch for pecuts carinatum on 10/14/2011     XI ROBOTIC ASSISTED HIATAL HERNIA REPAIR N/A 06/09/2020   Procedure: XI ROBOTIC ASSISTED HIATAL HERNIA REPAIR WITH FUNDOPLICATION WITH MESH;  Surgeon: Axel Filler, MD;  Location: [facility] OR;  Service: General;  Laterality: N/A;   XI ROBOTIC ASSISTED LAPAROSCOPIC NISSEN FUNDOPLICATION (N/A Abdomen)  06/09/2020       Family History  Problem Relation Age of Onset   Hypertension Mother    Diabetes Mother    Kidney cancer Mother 43   Hyperlipidemia Mother    Stomach cancer Father 57   Brain cancer Father 14   Hypertension Father    Hyperlipidemia Father    Prostate cancer Father 52   Thyroid disease Sister    Heart failure  Maternal Grandmother    Heart disease Maternal Grandmother    Lung cancer Maternal Grandmother 78   Heart disease Paternal Grandmother    Heart failure Paternal Grandmother    Lung cancer Paternal Grandmother 23   Stomach cancer Paternal Uncle 23   Stomach cancer Half-Brother 25       "no genetic testing"    Social History   Tobacco Use   Smoking status: Never   Smokeless tobacco: Never  Vaping Use   Vaping Use: Never used  Substance Use Topics   Alcohol use: Yes    Comment: occ. use   Drug use: No    Home Medications Prior to Admission medications   Medication Sig Start Date End Date Taking? Authorizing  Provider  acetaminophen (TYLENOL) 325 MG tablet Take 650 mg by mouth every 6 (six) hours as needed (pain).    [provider]  Adalimumab (HUMIRA) 40 MG/0.4ML PSKT Inject 40 mg into the skin every 14 (fourteen) days.    [provider]  HYDROcodone-acetaminophen (HYCET) 7.5-325 mg/15 ml solution Take 15 mLs by mouth 4 (four) times daily as needed for moderate pain. 06/11/20 06/11/21  Axel Filler, MD  ibuprofen (ADVIL) 200 MG tablet Take 600 mg by mouth every 6 (six) hours as needed for headache or moderate pain.    [provider]  ondansetron (ZOFRAN-ODT) 4 MG disintegrating tablet Take 4 mg by mouth 3 (three) times daily as needed. 01/22/20   [provider]  pantoprazole (PROTONIX) 40 MG tablet Take 40 mg by mouth 2 (two) times daily. 05/14/20   [provider]  sucralfate (CARAFATE) 1 GM/10ML suspension Take 1 g by mouth in the morning and at bedtime.    [provider]    Allergies    Patient has no known allergies.  Review of Systems   Review of Systems  Constitutional:  Negative for fever.  Gastrointestinal:  Negative for abdominal pain and bowel incontinence.  Genitourinary:  Negative for bladder incontinence and dysuria.  Musculoskeletal:  Positive for back pain.  Neurological:  Negative for numbness and paresthesias.  All other systems reviewed and are negative.  Physical Exam Updated Vital Signs BP 118/80 (BP Location: Right Arm)   Pulse 85   Temp 98.3 F (36.8 C) (Oral)   Resp 19   Ht 6\' 3"  (1.905 m)   Wt 117.9 kg   SpO2 100%   BMI 32.50 kg/m   Physical Exam Constitutional:      Appearance: Normal appearance.  HENT:     Head: Normocephalic and atraumatic.     Nose: Nose normal.     Mouth/Throat:     Mouth: Mucous membranes are moist.  Eyes:     Conjunctiva/sclera: Conjunctivae normal.  Cardiovascular:     Rate and Rhythm: Normal rate.  Pulmonary:     Effort: Pulmonary effort is normal.  Abdominal:      General: Bowel sounds are normal.     Palpations: Abdomen is soft.  Musculoskeletal:        General: Normal range of motion.     Cervical back: Normal range of motion.  Skin:    General: Skin is warm and dry.  Neurological:     General: No focal deficit present.     Mental Status: He is alert and oriented to person, place, and time.     Sensory: No sensory deficit.     Motor: No weakness.     Gait: Gait normal.  Psychiatric:  Mood and Affect: Mood normal.        Behavior: Behavior normal.    ED Results / Procedures / Treatments   Labs (all labs ordered are listed, but only abnormal results are displayed) Labs Reviewed - No data to display  EKG None  Radiology No results found.  Procedures Procedures   Medications Ordered in ED Medications  ketorolac (TORADOL) injection 60 mg (60 mg Intramuscular Given 02/10/21 2008)    ED Course  I have reviewed the triage vital signs and the nursing notes.  Pertinent labs & imaging results that were available during my care of the patient were reviewed by me and considered in my medical decision making (see chart for details).    MDM Rules/Calculators/A&P                           Patient with back pain.  No neurological deficits and normal neuro exam.  Patient is ambulatory.  No loss of bowel or bladder control.  No concern for cauda equina.  No fever, night sweats, weight loss, h/o cancer, IVDA, no recent procedure to back. No urinary symptoms suggestive of UTI.  Supportive care and return precaution discussed. Appears safe for discharge at this time. Follow up as indicated in discharge paperwork.   Final Clinical Impression(s) / ED Diagnoses Final diagnoses:  Acute midline low back pain without sciatica    Rx / DC Orders ED Discharge Orders          Ordered    methocarbamol (ROBAXIN) 500 MG tablet  2 times daily        02/10/21 1946    naproxen (NAPROSYN) 375 MG tablet  2 times daily        02/10/21 1946              Felicie Morn, NP 02/10/21 2023    Terrilee Files, MD 02/11/21 1106

## 2021-02-10 NOTE — Discharge Instructions (Addendum)
Please refer to the attached instructions. Follow-up with primary care if no improvement in symptoms with treatment.

## 2021-02-10 NOTE — ED Triage Notes (Addendum)
Pt with lower back pain that radiates up to neck x 2 weeks.  Denies any injury. Unable to see his PCP. Tried tylenol and motrin for it at home.

## 2021-02-10 NOTE — ED Notes (Signed)
Dc instructions and scripts reviewed with pt no questions or concerns at this time.  

## 2021-02-27 ENCOUNTER — Emergency Department (HOSPITAL_COMMUNITY)
Admission: EM | Admit: 2021-02-27 | Discharge: 2021-02-27 | Disposition: A | Discharge status: Home / Self Care | Payer: Commercial Managed Care - PPO | Attending: Emergency Medicine | Admitting: Emergency Medicine

## 2021-02-27 DIAGNOSIS — R509 Fever, unspecified: Secondary | ICD-10-CM | POA: Diagnosis not present

## 2021-02-27 DIAGNOSIS — J029 Acute pharyngitis, unspecified: Secondary | ICD-10-CM | POA: Diagnosis present

## 2021-02-27 DIAGNOSIS — J45909 Unspecified asthma, uncomplicated: Secondary | ICD-10-CM | POA: Diagnosis not present

## 2021-02-27 DIAGNOSIS — R059 Cough, unspecified: Secondary | ICD-10-CM | POA: Diagnosis not present

## 2021-02-27 LAB — GROUP A STREP BY PCR: Group A Strep by PCR: NOT DETECTED

## 2021-02-27 LAB — MONONUCLEOSIS SCREEN: Mono Screen: NEGATIVE

## 2021-02-27 MED ORDER — PREDNISONE 10 MG PO TABS: 30.0000 mg | ORAL_TABLET | Freq: Every day | ORAL | 0 refills | Status: AC

## 2021-02-27 MED ORDER — METHYLPREDNISOLONE SODIUM SUCC 125 MG IJ SOLR
125.0000 mg | Freq: Once | INTRAMUSCULAR | Status: AC
  Administered 2021-02-27: 125 mg via INTRAMUSCULAR
  Filled 2021-02-27: qty 2

## 2021-02-27 NOTE — Discharge Instructions (Signed)
Likely you have a viral sore throat, recommend over-the-counter pain medications every 4-6 hours as needed for pain, you can also use throat lozenges, this can help with your pain.  Given you steroids please use as prescribed.  May follow-up with your PCP in a week's time if symptoms not fully resolved.  Come back to the emergency department if you develop chest pain, shortness of breath, severe abdominal pain, uncontrolled nausea, vomiting, diarrhea.

## 2021-02-27 NOTE — ED Provider Notes (Signed)
[facility] Provider Note   CSN: [id number] Arrival date & time: 02/27/21  1508     History No chief complaint on file.   Devin Smith is a 25 y.o. male.  HPI  Patient with medical history including cardiomegaly presents with complaints of a sore throat.  Patient states been going on for about a week's time, he states that his throat hurts all the time but is worsened when he tries to eat or drink feels like a burning-like sensation, he also endorses that it hurts more when he is talking, states he has difficulty swallowing but still able to tolerate p.o., states he noticed a slight change in his voice, he states that he has a slight fever and a nonproductive cough.  He denies any headaches, congestion, chest pain, shortness of breath, abdominal pain, nausea, vomit, diarrhea, general body aches.  He is not immunocompromise, has gotten his COVID-vaccine has not gotten his flu vaccine in the last 10 years.  He has been taking over-the-counter pain medication as well as viscous lidocaine without much relief.  Patient was seen at [facility] a few days ago for similar presentation had negative strep test as well as respiratory panel and was later discharged home.  Past Medical History:  Diagnosis Date   Asthma    Cardiomegaly    Chest wall pain, chronic    Family history of kidney cancer    Family history of lung cancer    Family history of prostate cancer    Family history of stomach cancer    GERD (gastroesophageal reflux disease)    Pectus carinatum     Patient Active Problem List   Diagnosis Date Noted   S/P Nissen fundoplication (without gastrostomy tube) procedure 06/09/2020   Family history of stomach cancer    Family history of prostate cancer    Family history of kidney cancer    Family history of lung cancer    Diarrhea 05/01/2019   Rectal bleeding 12/19/2018   Family history of colon cancer 12/19/2018   Cardiomegaly 05/22/2015    Past  Surgical History:  Procedure Laterality Date    XI ROBOTIC ASSISTED HIATAL HERNIA REPAIR WITH FUNDOPLICATION WITH MESH (N/A Abdomen)  06/09/2020   BIOPSY  12/31/2018   Procedure: BIOPSY;  Surgeon: Danie Binder, MD;  [facility];  Service: Endoscopy;;  Ileal, Right , left colon biopsies    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   CHOLECYSTECTOMY     COLONOSCOPY WITH PROPOFOL N/A 12/31/2018   Procedure: COLONOSCOPY WITH PROPOFOL;  Surgeon: Danie Binder, MD;  Location: [facility];  Service: Endoscopy;  Laterality: N/A;  10:15am   ESOPHAGEAL MANOMETRY N/A 05/11/2020   Procedure: ESOPHAGEAL MANOMETRY (EM);  Surgeon: Ronnette Juniper, MD;  Location: [facility];  Service: Gastroenterology;  Laterality: N/A;   HERNIA REPAIR     INSERTION OF MESH N/A 06/09/2020   Procedure: INSERTION OF MESH;  Surgeon: Ralene Ok, MD;  Location: [place];  Service: General;  Laterality: N/A;   Ravitch for pecuts carinatum on 10/14/2011     XI ROBOTIC ASSISTED HIATAL HERNIA REPAIR N/A 06/09/2020   Procedure: XI ROBOTIC ASSISTED HIATAL HERNIA REPAIR WITH FUNDOPLICATION WITH MESH;  Surgeon: Ralene Ok, MD;  Location: [place];  Service: General;  Laterality: N/A;   XI ROBOTIC ASSISTED LAPAROSCOPIC NISSEN FUNDOPLICATION (N/A Abdomen)  06/09/2020       Family History  Problem Relation Age of Onset   Hypertension Mother  Diabetes Mother    Kidney cancer Mother 11   Hyperlipidemia Mother    Stomach cancer Father 45   Brain cancer Father 32   Hypertension Father    Hyperlipidemia Father    Prostate cancer Father 35   Thyroid disease Sister    Heart failure Maternal Grandmother    Heart disease Maternal Grandmother    Lung cancer Maternal Grandmother 78   Heart disease Paternal Grandmother    Heart failure Paternal Grandmother    Lung cancer Paternal Grandmother 14   Stomach cancer Paternal Uncle 72   Stomach cancer Half-Brother 25       "no genetic testing"    Social  History   Tobacco Use   Smoking status: Never   Smokeless tobacco: Never  Vaping Use   Vaping Use: Never used  Substance Use Topics   Alcohol use: Yes    Comment: occ. use   Drug use: No    Home Medications Prior to Admission medications   Medication Sig Start Date End Date Taking? Authorizing Provider  predniSONE (DELTASONE) 10 MG tablet Take 3 tablets (30 mg total) by mouth daily for 5 days. 02/27/21 03/04/21 Yes Marcello Fennel, PA-C  acetaminophen (TYLENOL) 325 MG tablet Take 650 mg by mouth every 6 (six) hours as needed (pain).    [provider]  Adalimumab (HUMIRA) 40 MG/0.4ML PSKT Inject 40 mg into the skin every 14 (fourteen) days.    [provider]  HYDROcodone-acetaminophen (HYCET) 7.5-325 mg/15 ml solution Take 15 mLs by mouth 4 (four) times daily as needed for moderate pain. 06/11/20 06/11/21  Ralene Ok, MD  ibuprofen (ADVIL) 200 MG tablet Take 600 mg by mouth every 6 (six) hours as needed for headache or moderate pain.    [provider]  methocarbamol (ROBAXIN) 500 MG tablet Take 1 tablet (500 mg total) by mouth 2 (two) times daily. 02/10/21   Etta Quill, NP  naproxen (NAPROSYN) 375 MG tablet Take 1 tablet (375 mg total) by mouth 2 (two) times daily. 02/10/21   Etta Quill, NP  ondansetron (ZOFRAN-ODT) 4 MG disintegrating tablet Take 4 mg by mouth 3 (three) times daily as needed. 01/22/20   [provider]  pantoprazole (PROTONIX) 40 MG tablet Take 40 mg by mouth 2 (two) times daily. 05/14/20   [provider]  sucralfate (CARAFATE) 1 GM/10ML suspension Take 1 g by mouth in the morning and at bedtime.    [provider]    Allergies    Patient has no known allergies.  Review of Systems   Review of Systems  Constitutional:  Positive for fever. Negative for chills.  HENT:  Positive for sore throat, trouble swallowing and voice change. Negative for congestion and drooling.   Respiratory:  Negative for  shortness of breath.   Cardiovascular:  Negative for chest pain.  Gastrointestinal:  Negative for abdominal pain.  Genitourinary:  Negative for enuresis.  Musculoskeletal:  Negative for back pain.  Skin:  Negative for rash.  Neurological:  Negative for dizziness.  Hematological:  Does not bruise/bleed easily.   Physical Exam Updated Vital Signs BP 117/61 (BP Location: Right Arm)    Pulse 78    Temp (!) 97.5 F (36.4 C)    Resp 14    Ht 6\' 3"  (1.905 m)    Wt 120.2 kg    SpO2 99%    BMI 33.12 kg/m   Physical Exam Vitals and nursing note reviewed.  Constitutional:  General: He is not in acute distress.    Appearance: He is not ill-appearing.  HENT:     Head: Normocephalic and atraumatic.     Nose: No congestion.     Mouth/Throat:     Mouth: Mucous membranes are moist.     Pharynx: Oropharyngeal exudate and posterior oropharyngeal erythema present.     Comments: No trismus or torticollis present, oropharynx is visualized tongue and uvula midline, controlling oral secretions, he had noted erythema noted on both tonsils and x-rays on the left tonsil, they are equal symmetrical bilaterally, there is no tongue elevation.  No palpable lymph nodes on exam. Eyes:     Conjunctiva/sclera: Conjunctivae normal.  Cardiovascular:     Rate and Rhythm: Normal rate and regular rhythm.     Pulses: Normal pulses.     Heart sounds: No murmur heard.   No friction rub. No gallop.  Pulmonary:     Effort: No respiratory distress.     Breath sounds: No wheezing, rhonchi or rales.  Abdominal:     Palpations: Abdomen is soft.     Tenderness: There is no abdominal tenderness. There is no right CVA tenderness or left CVA tenderness.  Skin:    General: Skin is warm and dry.  Neurological:     Mental Status: He is alert.  Psychiatric:        Mood and Affect: Mood normal.    ED Results / Procedures / Treatments   Labs (all labs ordered are listed, but only abnormal results are displayed) Labs  Reviewed  GROUP A STREP BY PCR  CULTURE, GROUP A STREP [facility])  MONONUCLEOSIS SCREEN    EKG None  Radiology No results found.  Procedures Procedures   Medications Ordered in ED Medications  methylPREDNISolone sodium succinate (SOLU-MEDROL) 125 mg/2 mL injection 125 mg (125 mg Intramuscular Given 02/27/21 1611)    ED Course  I have reviewed the triage vital signs and the nursing notes.  Pertinent labs & imaging results that were available during my care of the patient were reviewed by me and considered in my medical decision making (see chart for details).    MDM Rules/Calculators/A&P                         Initial impression-presents with a sore throat.  Alert, no acute stress, vital signs reassuring.  Likely this is a viral infection, but due to the exudates and erythema present will obtain strep test and culture this, also obtain mono and reassess.  Work-up-strep test is negative, throat culture pending, monotest is negative  Reassessment-patient is reassessed sitting in chair talking on the phone no acute distress present, talking full sentences, vital signs remained stable, he is agreeable for discharge.  Rule out- Low suspicion for systemic infection as patient is nontoxic-appearing, vital signs reassuring, no obvious source infection noted on exam.  Low suspicion for pneumonia as lung sounds are clear bilaterally, will defer imaging at this time.  I have low suspicion for PE as patient denies pleuritic chest pain, shortness of breath, patient is PERC. low suspicion for strep throat rapid strep test is negative x2.  Low suspicion for retropharyngeal and/or peritonsillar abscess there is no uvular deviation, tonsils are equal symmetric bilaterally, no tongue or uvula deviation present.,  No trismus or torticollis noted.  Low suspicion patient would need  hospitalized due to viral infection or Covid as vital signs reassuring, patient is not in respiratory distress.  Plan-  Sore throat-likely this is more viral in nature Centor score of 2, will defer antibiotics at this time, provide him with steroids, recommend over-the-counter pain medications, follow-up with PCP as needed.  Vital signs have remained stable, no indication for hospital admission.  Patient given at home care as well strict return precautions.  Patient verbalized that they understood agreed to said plan.     Final Clinical Impression(s) / ED Diagnoses Final diagnoses:  Pharyngitis, unspecified etiology    Rx / DC Orders ED Discharge Orders          Ordered    predniSONE (DELTASONE) 10 MG tablet  Daily        02/27/21 1752             Carroll Sage, PA-C 02/27/21 1754    Mancel Bale, MD 02/28/21 2154

## 2021-02-27 NOTE — ED Triage Notes (Signed)
Swollen throat x 1 week with trouble swallowing

## 2021-03-02 LAB — CULTURE, GROUP A STREP (THRC)

## 2021-07-15 ENCOUNTER — Other Ambulatory Visit: Payer: Self-pay

## 2021-07-15 ENCOUNTER — Encounter: Payer: Self-pay | Admitting: Emergency Medicine

## 2021-07-15 ENCOUNTER — Ambulatory Visit
Admission: EM | Admit: 2021-07-15 | Discharge: 2021-07-15 | Disposition: A | Discharge status: Home / Self Care | Payer: Commercial Managed Care - PPO | Attending: Nurse Practitioner | Admitting: Nurse Practitioner

## 2021-07-15 DIAGNOSIS — M5441 Lumbago with sciatica, right side: Secondary | ICD-10-CM

## 2021-07-15 MED ORDER — METHYLPREDNISOLONE SODIUM SUCC 125 MG IJ SOLR
80.0000 mg | Freq: Once | INTRAMUSCULAR | Status: AC
  Administered 2021-07-15: 80 mg via INTRAMUSCULAR

## 2021-07-15 MED ORDER — PREDNISONE 50 MG PO TABS: ORAL_TABLET | ORAL | 0 refills | Status: DC

## 2021-07-15 NOTE — ED Provider Notes (Signed)
?[facility] ? ? ? ?CSN: [id number] ?Arrival date & time: 07/15/21  1730 ? ? ?  ? ?History   ?Chief Complaint ?Chief Complaint  ?Patient presents with  ? Back Pain  ? ? ?HPI ?Devin Smith is a 26 y.o. male.  ? ?The patient is a 26 year old male who presents for right-sided low back pain.  Symptoms started approximately 1 week ago.  Patient states the pain radiates down into the right lower leg.  He is also complaining of numbness and tingling in the right lower extremity.  He denies any loss of bowel or bladder function, urinary symptoms, abdominal pain, or change in his bowel pattern.  He denies any previous injury or trauma.  He states that he has not taken any medication for his symptoms. ? ?The history is provided by the patient.  ?Back Pain ?Context: recent illness   ?Context: not recent injury   ?Associated symptoms: leg pain (right), numbness and tingling   ?Associated symptoms: no abdominal pain, no abdominal swelling, no bladder incontinence, no bowel incontinence, no perianal numbness and no weakness   ?Risk factors: obesity   ? ?Past Medical History:  ?Diagnosis Date  ? Asthma   ? Cardiomegaly   ? Chest wall pain, chronic   ? Family history of kidney cancer   ? Family history of lung cancer   ? Family history of prostate cancer   ? Family history of stomach cancer   ? GERD (gastroesophageal reflux disease)   ? Pectus carinatum   ? ? ?Patient Active Problem List  ? Diagnosis Date Noted  ? S/P Nissen fundoplication (without gastrostomy tube) procedure 06/09/2020  ? Family history of stomach cancer   ? Family history of prostate cancer   ? Family history of kidney cancer   ? Family history of lung cancer   ? Diarrhea 05/01/2019  ? Rectal bleeding 12/19/2018  ? Family history of colon cancer 12/19/2018  ? Cardiomegaly 05/22/2015  ? ? ?Past Surgical History:  ?Procedure Laterality Date  ?  XI ROBOTIC ASSISTED HIATAL HERNIA REPAIR WITH FUNDOPLICATION WITH MESH (N/A Abdomen)  06/09/2020  ? BIOPSY   12/31/2018  ? Procedure: BIOPSY;  Surgeon: West Bali, MD;  Location: [facility];  Service: Endoscopy;;  Ileal, Right , left colon biopsies   ? CHOLECYSTECTOMY  07/2018  ? DUE TO BILIARY DYSKINESIA  ? CHOLECYSTECTOMY    ? COLONOSCOPY WITH PROPOFOL N/A 12/31/2018  ? Procedure: COLONOSCOPY WITH PROPOFOL;  Surgeon: West Bali, MD;  Location: [facility];  Service: Endoscopy;  Laterality: N/A;  10:15am  ? ESOPHAGEAL MANOMETRY N/A 05/11/2020  ? Procedure: ESOPHAGEAL MANOMETRY (EM);  Surgeon: Kerin Salen, MD;  Location: [facility];  Service: Gastroenterology;  Laterality: N/A;  ? HERNIA REPAIR    ? INSERTION OF MESH N/A 06/09/2020  ? Procedure: INSERTION OF MESH;  Surgeon: Axel Filler, MD;  Location: [facility] OR;  Service: General;  Laterality: N/A;  ? [place] for pecuts carinatum on 10/14/2011    ? XI ROBOTIC ASSISTED HIATAL HERNIA REPAIR N/A 06/09/2020  ? Procedure: XI ROBOTIC ASSISTED HIATAL HERNIA REPAIR WITH FUNDOPLICATION WITH MESH;  Surgeon: Axel Filler, MD;  Location: [facility] OR;  Service: General;  Laterality: N/A;  ? XI ROBOTIC ASSISTED LAPAROSCOPIC NISSEN FUNDOPLICATION (N/A Abdomen)  06/09/2020  ? ? ? ? ? ?Home Medications   ? ?Prior to Admission medications   ?Medication Sig Start Date End Date Taking? Authorizing Provider  ?predniSONE (DELTASONE) 50 MG tablet Take 1 tablet daily with  breakfast for 5 days. 07/15/21  Yes Laniyah Rosenwald-Warren, Sadie Haberhristie J, NP  ?acetaminophen (TYLENOL) 325 MG tablet Take 650 mg by mouth every 6 (six) hours as needed (pain).    [provider]  ?Adalimumab (HUMIRA) 40 MG/0.4ML PSKT Inject 40 mg into the skin every 14 (fourteen) days.    [provider]  ?ibuprofen (ADVIL) 200 MG tablet Take 600 mg by mouth every 6 (six) hours as needed for headache or moderate pain.    [provider]  ?methocarbamol (ROBAXIN) 500 MG tablet Take 1 tablet (500 mg total) by mouth 2 (two) times daily. 02/10/21   Felicie MornSmith, David, NP  ?naproxen (NAPROSYN) 375 MG tablet Take  1 tablet (375 mg total) by mouth 2 (two) times daily. 02/10/21   Felicie MornSmith, David, NP  ?ondansetron (ZOFRAN-ODT) 4 MG disintegrating tablet Take 4 mg by mouth 3 (three) times daily as needed. 01/22/20   [provider]  ?pantoprazole (PROTONIX) 40 MG tablet Take 40 mg by mouth 2 (two) times daily. 05/14/20   [provider]  ?sucralfate (CARAFATE) 1 GM/10ML suspension Take 1 g by mouth in the morning and at bedtime.    [provider]  ? ? ?Family History ?Family History  ?Problem Relation Age of Onset  ? Hypertension Mother   ? Diabetes Mother   ? Kidney cancer Mother 3341  ? Hyperlipidemia Mother   ? Stomach cancer Father 3748  ? Brain cancer Father 9248  ? Hypertension Father   ? Hyperlipidemia Father   ? Prostate cancer Father 1348  ? Thyroid disease Sister   ? Heart failure Maternal Grandmother   ? Heart disease Maternal Grandmother   ? Lung cancer Maternal Grandmother 7378  ? Heart disease Paternal Grandmother   ? Heart failure Paternal Grandmother   ? Lung cancer Paternal Grandmother 5278  ? Stomach cancer Paternal Uncle 4051  ? Stomach cancer Half-Brother 25  ?     "no genetic testing"  ? ? ?Social History ?Social History  ? ?Tobacco Use  ? Smoking status: Never  ? Smokeless tobacco: Never  ?Vaping Use  ? Vaping Use: Never used  ?Substance Use Topics  ? Alcohol use: Yes  ?  Comment: occ. use  ? Drug use: No  ? ? ? ?Allergies   ?Patient has no known allergies. ? ? ?Review of Systems ?Review of Systems  ?Constitutional: Negative.   ?HENT: Negative.    ?Respiratory: Negative.    ?Cardiovascular: Negative.   ?Gastrointestinal:  Negative for abdominal pain and bowel incontinence.  ?Genitourinary:  Negative for bladder incontinence.  ?Musculoskeletal:  Positive for back pain.  ?Skin: Negative.   ?Neurological:  Positive for tingling and numbness. Negative for weakness.  ?Psychiatric/Behavioral: Negative.    ? ? ?Physical Exam ?Triage Vital Signs ?ED Triage Vitals  ?Enc Vitals Group  ?   BP 07/15/21 1742  119/74  ?   Pulse Rate 07/15/21 1742 97  ?   Resp 07/15/21 1742 18  ?   Temp 07/15/21 1742 98 ?F (36.7 ?C)  ?   Temp Source 07/15/21 1742 Oral  ?   SpO2 07/15/21 1742 98 %  ?   Weight 07/15/21 1743 280 lb (127 kg)  ?   Height 07/15/21 1743 6\' 3"  (1.905 m)  ?   Head Circumference --   ?   Peak Flow --   ?   Pain Score 07/15/21 1743 8  ?   Pain Loc --   ?   Pain Edu? --   ?  Excl. in GC? --   ? ?No data found. ? ?Updated Vital Signs ?BP 119/74 (BP Location: Right Arm)   Pulse 97   Temp 98 ?F (36.7 ?C) (Oral)   Resp 18   Ht 6\' 3"  (1.905 m)   Wt 280 lb (127 kg)   SpO2 98%   BMI 35.00 kg/m?  ? ?Visual Acuity ?Right Eye Distance:   ?Left Eye Distance:   ?Bilateral Distance:   ? ?Right Eye Near:   ?Left Eye Near:    ?Bilateral Near:    ? ?Physical Exam ?Vitals and nursing note reviewed.  ?Constitutional:   ?   General: He is not in acute distress. ?   Appearance: Normal appearance.  ?HENT:  ?   Head: Normocephalic.  ?Cardiovascular:  ?   Rate and Rhythm: Normal rate and regular rhythm.  ?   Pulses: Normal pulses.  ?   Heart sounds: Normal heart sounds.  ?Pulmonary:  ?   Effort: Pulmonary effort is normal.  ?   Breath sounds: Normal breath sounds.  ?Abdominal:  ?   General: Bowel sounds are normal.  ?   Palpations: Abdomen is soft.  ?   Tenderness: There is no abdominal tenderness.  ?Musculoskeletal:  ?   Lumbar back: No swelling or deformity. Decreased range of motion. Negative right straight leg raise test and negative left straight leg raise test.  ?   Comments: Tenderness to the right lower back along the sciatic nerve.  ?Skin: ?   General: Skin is warm and dry.  ?   Capillary Refill: Capillary refill takes less than 2 seconds.  ?Neurological:  ?   General: No focal deficit present.  ?   Mental Status: He is alert and oriented to person, place, and time.  ?Psychiatric:     ?   Mood and Affect: Mood normal.     ?   Behavior: Behavior normal.  ? ? ? ?UC Treatments / Results  ?Labs ?(all labs ordered are listed,  but only abnormal results are displayed) ?Labs Reviewed - No data to display ? ?EKG ? ? ?Radiology ?No results found. ? ?Procedures ?Procedures (including critical care time) ? ?Medications Ordered in UC ?Medication

## 2021-07-15 NOTE — ED Triage Notes (Signed)
Pt reports right sided lower back pain and radiating to right leg. Pt denies any known injury and reports intermittent numbness.  ?

## 2021-07-15 NOTE — Discharge Instructions (Addendum)
Your symptoms are consistent with sciatica. ?You were given an injection of Solu-Medrol you have been prescribed prednisone, which are steroids.  Do not take any ibuprofen, Motrin, naproxen, or Naprosyn or other NSAIDs while you are taking these medications. ?You may take Tylenol extra strength 500 mg or arthritis strength 650 mg as needed for breakthrough pain. ?Try to stay as active as possible. ?Recommend the use of ice or heat as needed for back pain, which ever helps her symptoms.  Apply for 20 minutes, remove for 1 hour, then repeat as needed. ?Follow-up in the ER immediately if you develop loss of bowel or bladder function, worsening numbness or tingling that causes inability to walk.  Or other concerns. ?

## 2021-10-10 ENCOUNTER — Emergency Department (HOSPITAL_COMMUNITY): Admission: EM | Admit: 2021-10-10 | Discharge: 2021-10-10 | Discharge status: Against Medical Advice | Payer: Self-pay

## 2021-10-10 NOTE — ED Notes (Signed)
Pt called in waiting room with no answer.  

## 2021-12-14 ENCOUNTER — Other Ambulatory Visit (HOSPITAL_COMMUNITY): Payer: Self-pay | Admitting: General Surgery

## 2021-12-14 DIAGNOSIS — Z8719 Personal history of other diseases of the digestive system: Secondary | ICD-10-CM

## 2021-12-14 DIAGNOSIS — R1319 Other dysphagia: Secondary | ICD-10-CM

## 2021-12-17 ENCOUNTER — Ambulatory Visit (HOSPITAL_COMMUNITY)
Admission: RE | Admit: 2021-12-17 | Discharge: 2021-12-17 | Disposition: A | Discharge status: Home / Self Care | Payer: Self-pay | Source: Ambulatory Visit | Attending: General Surgery | Admitting: General Surgery

## 2021-12-17 ENCOUNTER — Other Ambulatory Visit (HOSPITAL_COMMUNITY): Payer: Self-pay | Admitting: General Surgery

## 2021-12-17 DIAGNOSIS — R1319 Other dysphagia: Secondary | ICD-10-CM | POA: Insufficient documentation

## 2021-12-17 DIAGNOSIS — Z8719 Personal history of other diseases of the digestive system: Secondary | ICD-10-CM | POA: Insufficient documentation

## 2022-01-26 DIAGNOSIS — R5383 Other fatigue: Secondary | ICD-10-CM

## 2022-01-26 DIAGNOSIS — R0681 Apnea, not elsewhere classified: Secondary | ICD-10-CM

## 2022-01-26 DIAGNOSIS — R0683 Snoring: Secondary | ICD-10-CM

## 2022-02-07 ENCOUNTER — Ambulatory Visit: Payer: No Typology Code available for payment source | Attending: Physician Assistant | Admitting: Neurology

## 2022-02-07 DIAGNOSIS — G4733 Obstructive sleep apnea (adult) (pediatric): Secondary | ICD-10-CM | POA: Insufficient documentation

## 2022-02-07 DIAGNOSIS — R0683 Snoring: Secondary | ICD-10-CM | POA: Diagnosis present

## 2022-02-07 DIAGNOSIS — R0681 Apnea, not elsewhere classified: Secondary | ICD-10-CM

## 2022-02-07 DIAGNOSIS — R5383 Other fatigue: Secondary | ICD-10-CM

## 2022-02-14 NOTE — Procedures (Unsigned)
  [facility] Joziyah Roblero A. Gerilyn Pilgrim, MD     [email or web address]             NOCTURNAL POLYSOMNOGRAPHY   LOCATION: [facility]  Patient Name: Devin Smith, Devin Smith Date: 02/07/2022 Gender: Male D.O.B: Aug 06, 1995 Age (years): 26 Referring Provider: Aline Brochure PA-C Height (inches): 75 Interpreting Physician: Beryle Beams MD, ABSM Weight (lbs): 280 RPSGT: Alfonso Ellis BMI: 35 MRN: [id number] Neck Size: <br> <br> <br> CLINICAL INFORMATION Sleep Study Type: HST    Indication for sleep study: N/A    Epworth Sleepiness Score: N/A  SLEEP STUDY TECHNIQUE A multi-channel overnight portable sleep study was performed. The channels recorded were: nasal airflow, thoracic respiratory movement, and oxygen saturation with a pulse oximetry. Snoring was also monitored.  MEDICATIONS Patient self administered medications include: N/A.  Current Outpatient Medications:    acetaminophen (TYLENOL) 325 MG tablet, Take 650 mg by mouth every 6 (six) hours as needed (pain)., Disp: , Rfl:    Adalimumab (HUMIRA) 40 MG/0.4ML PSKT, Inject 40 mg into the skin every 14 (fourteen) days., Disp: , Rfl:    ibuprofen (ADVIL) 200 MG tablet, Take 600 mg by mouth every 6 (six) hours as needed for headache or moderate pain., Disp: , Rfl:    methocarbamol (ROBAXIN) 500 MG tablet, Take 1 tablet (500 mg total) by mouth 2 (two) times daily., Disp: 20 tablet, Rfl: 0   naproxen (NAPROSYN) 375 MG tablet, Take 1 tablet (375 mg total) by mouth 2 (two) times daily., Disp: 20 tablet, Rfl: 0   ondansetron (ZOFRAN-ODT) 4 MG disintegrating tablet, Take 4 mg by mouth 3 (three) times daily as needed., Disp: , Rfl:    pantoprazole (PROTONIX) 40 MG tablet, Take 40 mg by mouth 2 (two) times daily., Disp: , Rfl:    predniSONE (DELTASONE) 50 MG tablet, Take 1 tablet daily with breakfast for 5 days., Disp: 5 tablet, Rfl: 0   sucralfate (CARAFATE) 1 GM/10ML suspension, Take 1 g by mouth in the morning and at bedtime.,  Disp: , Rfl:    SLEEP ARCHITECTURE Patient was studied for 473.9 minutes. The sleep efficiency was 98.7 % and the patient was supine for 0%. The arousal index was 0.1 per hour.  RESPIRATORY PARAMETERS The overall AHI was 23.7 per hour, with a central apnea index of 0 per hour.  The oxygen nadir was 83% during sleep.    CARDIAC DATA Mean heart rate during sleep was 75.7 bpm.  IMPRESSIONS - Moderate obstructive sleep apnea occurred during this study (AHI = 23.7/h). Auto PAP 8-14 is recommended.   Argie Ramming, MD Diplomate, American Board of Sleep Medicine.  ELECTRONICALLY SIGNED ON:  02/14/2022, 9:08 AM [facility] PH: [phone]   FX: [phone] ACCREDITED BY THE AMERICAN ACADEMY OF SLEEP MEDICINE

## 2022-03-22 DIAGNOSIS — M5417 Radiculopathy, lumbosacral region: Secondary | ICD-10-CM | POA: Diagnosis not present

## 2022-03-22 DIAGNOSIS — M5441 Lumbago with sciatica, right side: Secondary | ICD-10-CM | POA: Diagnosis not present

## 2022-04-06 DIAGNOSIS — M5441 Lumbago with sciatica, right side: Secondary | ICD-10-CM | POA: Diagnosis not present

## 2022-04-06 DIAGNOSIS — M5417 Radiculopathy, lumbosacral region: Secondary | ICD-10-CM | POA: Diagnosis not present

## 2022-04-06 DIAGNOSIS — M5127 Other intervertebral disc displacement, lumbosacral region: Secondary | ICD-10-CM | POA: Diagnosis not present

## 2022-04-06 DIAGNOSIS — M545 Low back pain, unspecified: Secondary | ICD-10-CM | POA: Diagnosis not present

## 2022-04-20 DIAGNOSIS — M5417 Radiculopathy, lumbosacral region: Secondary | ICD-10-CM | POA: Diagnosis not present

## 2022-05-04 DIAGNOSIS — M5418 Radiculopathy, sacral and sacrococcygeal region: Secondary | ICD-10-CM | POA: Diagnosis not present

## 2022-05-04 DIAGNOSIS — G8929 Other chronic pain: Secondary | ICD-10-CM | POA: Diagnosis not present

## 2022-05-04 DIAGNOSIS — M5417 Radiculopathy, lumbosacral region: Secondary | ICD-10-CM | POA: Diagnosis not present

## 2022-05-04 DIAGNOSIS — M5441 Lumbago with sciatica, right side: Secondary | ICD-10-CM | POA: Diagnosis not present

## 2022-05-05 DIAGNOSIS — M5416 Radiculopathy, lumbar region: Secondary | ICD-10-CM | POA: Diagnosis not present

## 2022-06-14 ENCOUNTER — Ambulatory Visit (INDEPENDENT_AMBULATORY_CARE_PROVIDER_SITE_OTHER): Payer: No Typology Code available for payment source | Admitting: Orthopedic Surgery

## 2022-06-14 ENCOUNTER — Encounter: Payer: Self-pay | Admitting: Orthopedic Surgery

## 2022-06-14 ENCOUNTER — Other Ambulatory Visit: Payer: No Typology Code available for payment source

## 2022-06-14 VITALS — BP 114/71 | HR 70 | Ht 75.0 in | Wt 333.2 lb

## 2022-06-14 DIAGNOSIS — M5416 Radiculopathy, lumbar region: Secondary | ICD-10-CM

## 2022-06-14 DIAGNOSIS — M545 Low back pain, unspecified: Secondary | ICD-10-CM

## 2022-06-14 MED ORDER — PREGABALIN 75 MG PO CAPS: 75.0000 mg | ORAL_CAPSULE | Freq: Two times a day (BID) | ORAL | 0 refills | Status: DC

## 2022-06-14 NOTE — Progress Notes (Signed)
Orthopedic Spine Surgery Office Note  Assessment: Patient is a 27 y.o. male with low back pain that radiates into his right anterior lateral thigh and lateral leg.  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 Discussed that the conservative treatments include:  -activity modification  -physical therapy  -over the counter pain medications  -medrol dosepak  -lumbar steroid injections -Patient has tried physical therapy, steroid injections, gabapentin -Recommended trial of Lyrica.  Since he is not a surgical candidate, recommended pain management where they can do injections and treat his pain -If surgery is ever considered as a treatment option, patient would need to lose weight to reduce his risk of complication. Gave him a weight goal of 280 -I would also need to see his MRI prior to any surgical invention so I asked him to her sign release -Patient should return to office if he develops new symptoms, including but not limited to weakness, bowel bladder incontinence, saddle anesthesia.he should also come back if he gets down to a weight of 280 wants to talk further about surgery   Patient expressed understanding of the plan and all questions were answered to the patient's satisfaction.   ___________________________________________________________________________   History:  Patient is a 27 y.o. male who presents today for lumbar spine.  Patient has had about 2 years of pain in his low back that radiates into his right lower extremity.  Feels it starts in the low back and radiates into the right anterior lateral thigh and into the lateral leg.  It goes all the way to the level of the ankle.  No symptoms on the left lower extremity.  There is no trauma or injury that brought on the pain.  Pain is gotten progressively worse  with time.  He describes it as severe in nature.  He has tried prior injections in the [facility] system and did not provide him any relief.  He is also tried gabapentin which did not help.  He is also done a course of physical therapy which did not help.  He has seen another surgeon in the [facility] system who said he is not optimized for surgery was not a candidate at this time.  For that reason, he was referred to me.   Weakness: Denies Symptoms of imbalance: Denies Paresthesias and numbness: Yes, decreased sensation over the anterior lateral right thigh.  No other numbness or paresthesias Bowel or bladder incontinence: Denies Saddle anesthesia: Denies  Treatments tried: Physical therapy, steroid injections, gabapentin  Review of systems: Denies fevers and chills, night sweats, unexplained weight loss, history of cancer.  Has had pain that wakes him at night  Past medical history: Crohn's disease Depression/anxiety  Allergies: NKDA  Past surgical history:  Cholecystectomy Hiatal hernia repair  Social history: Denies use of nicotine product (smoking, vaping, patches, smokeless) Alcohol use: Denies Denies recreational drug use   Physical Exam:  General: no acute distress, appears stated age Neurologic: alert, answering questions appropriately, following commands Respiratory: unlabored breathing on room air, symmetric chest rise Psychiatric: appropriate affect, normal cadence to speech   MSK (spine):  -Strength exam      Left  Right EHL    5/5  5/5 TA    5/5  5/5 GSC    5/5  5/5 Knee extension  5/5  5/5 Hip flexion   5/5  5/5  -Sensory exam  Sensation intact to light touch in L3-S1 nerve distributions of bilateral lower extremities  -Achilles DTR: 2/4 on the left, 2/4 on the right -Patellar tendon DTR: 2/4 on the left, 2/4 on the right  -Straight leg raise: Negative bilaterally -Femoral nerve stretch test: Negative bilaterally -Clonus: no beats  bilaterally  -Left hip exam: No pain through range of motion, negative Stinchfield, negative FABER -Right hip exam: No pain through range of motion, negative Stinchfield, negative FABER  Imaging: XR of the lumbar spine from 06/14/2022 was independently reviewed and interpreted, showing no significant degenerative changes.  No fracture dislocation.  No evidence of instability on flexion/extension views.  Lordotic alignment.  Patient showed me an MRI report on his phone that describe a right-sided foraminal L5/S1 disc herniation   Patient name: Devin Smith Patient MRN: [id number] Date of visit: 06/14/22

## 2022-09-29 ENCOUNTER — Other Ambulatory Visit: Payer: Self-pay

## 2022-09-29 ENCOUNTER — Emergency Department (HOSPITAL_COMMUNITY)
Admission: EM | Admit: 2022-09-29 | Discharge: 2022-09-29 | Disposition: A | Discharge status: Home / Self Care | Payer: Self-pay | Attending: Emergency Medicine | Admitting: Emergency Medicine

## 2022-09-29 ENCOUNTER — Emergency Department (HOSPITAL_COMMUNITY): Payer: Self-pay

## 2022-09-29 ENCOUNTER — Encounter (HOSPITAL_COMMUNITY): Payer: Self-pay

## 2022-09-29 DIAGNOSIS — N451 Epididymitis: Secondary | ICD-10-CM | POA: Insufficient documentation

## 2022-09-29 LAB — URINALYSIS, ROUTINE W REFLEX MICROSCOPIC
Bilirubin Urine: NEGATIVE
Glucose, UA: NEGATIVE mg/dL
Hgb urine dipstick: NEGATIVE
Ketones, ur: NEGATIVE mg/dL
Leukocytes,Ua: NEGATIVE
Nitrite: NEGATIVE
Protein, ur: NEGATIVE mg/dL
Specific Gravity, Urine: 1.023 (ref 1.005–1.030)
pH: 5 (ref 5.0–8.0)

## 2022-09-29 MED ORDER — NAPROXEN 250 MG PO TABS
500.0000 mg | ORAL_TABLET | Freq: Once | ORAL | Status: AC
  Administered 2022-09-29: 500 mg via ORAL
  Filled 2022-09-29: qty 2

## 2022-09-29 MED ORDER — CIPROFLOXACIN HCL 500 MG PO TABS: 500.0000 mg | ORAL_TABLET | Freq: Two times a day (BID) | ORAL | 0 refills | Status: DC

## 2022-09-29 MED ORDER — CIPROFLOXACIN HCL 250 MG PO TABS
500.0000 mg | ORAL_TABLET | Freq: Once | ORAL | Status: AC
  Administered 2022-09-29: 500 mg via ORAL
  Filled 2022-09-29: qty 2

## 2022-09-29 MED ORDER — NAPROXEN 375 MG PO TABS: 375.0000 mg | ORAL_TABLET | Freq: Two times a day (BID) | ORAL | 0 refills | Status: DC

## 2022-09-29 NOTE — ED Notes (Signed)
Standby for [place] complete

## 2022-09-29 NOTE — ED Triage Notes (Signed)
LEFT testicle pain started last night Radiates to the right Swollen, numb and tingling Nausea Denies discharge from penis

## 2022-09-29 NOTE — Discharge Instructions (Addendum)
Take the medications as prescribed.  Follow-up with your primary care doctor or a urologist to be rechecked if the symptoms do not resolve

## 2022-09-29 NOTE — ED Notes (Signed)
Patient verbalizes understanding of discharge instructions. Opportunity for questioning and answers were provided. Armband removed by staff, pt discharged from ED. Ambulated out to lobby  

## 2022-09-29 NOTE — ED Provider Notes (Signed)
[place] EMERGENCY DEPARTMENT AT [facility] Provider Note   CSN: [id number] Arrival date & time: 09/29/22  2026     History  Chief Complaint  Patient presents with   Testicle Pain    Devin Smith is a 27 y.o. male.   Testicle Pain     Patient presents ED with complaints of testicle pain.  Patient states the symptoms started last evening.  He started having symptoms discomfort first on the left but then moved to the right testicle.  He denies any penile discharge.  He has had some suprapubic discomfort.  He denies any recent falls or injuries.  Patient tried going to work today but continued to have pain.  He reached out to his doctor who suggested he come to the ED for evaluation.    Home Medications Prior to Admission medications   Medication Sig Start Date End Date Taking? Authorizing Provider  ciprofloxacin (CIPRO) 500 MG tablet Take 1 tablet (500 mg total) by mouth 2 (two) times daily. 09/29/22  Yes Linwood Dibbles, MD  naproxen (NAPROSYN) 375 MG tablet Take 1 tablet (375 mg total) by mouth 2 (two) times daily. 09/29/22  Yes Linwood Dibbles, MD  acetaminophen (TYLENOL) 325 MG tablet Take 650 mg by mouth every 6 (six) hours as needed (pain).    [provider]  Adalimumab (HUMIRA) 40 MG/0.4ML PSKT Inject 40 mg into the skin every 14 (fourteen) days.    [provider]  ibuprofen (ADVIL) 200 MG tablet Take 600 mg by mouth every 6 (six) hours as needed for headache or moderate pain.    [provider]  methocarbamol (ROBAXIN) 500 MG tablet Take 1 tablet (500 mg total) by mouth 2 (two) times daily. 02/10/21   Felicie Morn, NP  ondansetron (ZOFRAN-ODT) 4 MG disintegrating tablet Take 4 mg by mouth 3 (three) times daily as needed. 01/22/20   [provider]  pantoprazole (PROTONIX) 40 MG tablet Take 40 mg by mouth 2 (two) times daily. 05/14/20   [provider]  predniSONE (DELTASONE) 50 MG tablet Take 1 tablet daily with breakfast for 5  days. 07/15/21   Leath-Warren, Sadie Haber, NP  pregabalin (LYRICA) 75 MG capsule Take 1 capsule (75 mg total) by mouth 2 (two) times daily. 06/14/22 07/14/22  London Sheer, MD  sucralfate (CARAFATE) 1 GM/10ML suspension Take 1 g by mouth in the morning and at bedtime.    [provider]      Allergies    Patient has no known allergies.    Review of Systems   Review of Systems  Genitourinary:  Positive for testicular pain.    Physical Exam Updated Vital Signs BP 124/76   Pulse 72   Temp 98.2 F (36.8 C)   Resp 17   Ht 1.93 m (6\' 4" )   Wt (!) 154.2 kg   SpO2 97%   BMI 41.39 kg/m  Physical Exam Vitals and nursing note reviewed.  Constitutional:      General: He is not in acute distress.    Appearance: He is well-developed.  HENT:     Head: Normocephalic and atraumatic.     Right Ear: External ear normal.     Left Ear: External ear normal.  Eyes:     General: No scleral icterus.       Right eye: No discharge.        Left eye: No discharge.     Conjunctiva/sclera: Conjunctivae normal.  Neck:     Trachea:  No tracheal deviation.  Cardiovascular:     Rate and Rhythm: Normal rate.  Pulmonary:     Effort: Pulmonary effort is normal. No respiratory distress.     Breath sounds: No stridor.  Abdominal:     General: There is no distension.     Palpations: There is no mass.     Tenderness: There is no abdominal tenderness.  Genitourinary:    Testes:        Right: Tenderness present. Mass or swelling not present.        Left: Tenderness present. Mass or swelling not present.     Epididymis:     Right: Tenderness present.     Left: Tenderness present.  Musculoskeletal:        General: No swelling or deformity.     Cervical back: Neck supple.  Skin:    General: Skin is warm and dry.     Findings: No rash.  Neurological:     Mental Status: He is alert. Mental status is at baseline.     Cranial Nerves: No dysarthria or facial asymmetry.     Motor: No seizure  activity.     ED Results / Procedures / Treatments   Labs (all labs ordered are listed, but only abnormal results are displayed) Labs Reviewed  URINALYSIS, ROUTINE W REFLEX MICROSCOPIC  HIV ANTIBODY (ROUTINE TESTING W REFLEX)  GC/CHLAMYDIA PROBE AMP ([place]) NOT AT [facility]    EKG None  Radiology US SCROTUM W/DOPPLER  Result Date: 09/29/2022 CLINICAL DATA:  Left testicle pain and swelling. Some right testicle pain EXAM: SCROTAL ULTRASOUND DOPPLER ULTRASOUND OF THE TESTICLES TECHNIQUE: Complete ultrasound examination of the testicles, epididymis, and other scrotal structures was performed. Color and spectral Doppler ultrasound were also utilized to evaluate blood flow to the testicles. COMPARISON:  Scrotal ultrasound 07/20/2016 FINDINGS: Right testicle Measurements: 4.5 x 2.4 x 3.0 cm. No mass or microlithiasis visualized. Left testicle Measurements: 4.2 x 1.8 x 2.8 cm. No mass or microlithiasis visualized. Right epididymis:  Normal in size and appearance. Left epididymis:  Normal in size and appearance. Hydrocele:  None visualized. Varicocele:  Left varicocele. Pulsed Doppler interrogation of both testes demonstrates normal low resistance arterial and venous waveforms bilaterally. IMPRESSION: 1. No acute sonographic abnormality.  No torsion. 2. Left varicocele. Electronically Signed   By: Minerva Fester M.D.   On: 09/29/2022 22:41    Procedures Procedures    Medications Ordered in ED Medications  ciprofloxacin (CIPRO) tablet 500 mg (has no administration in time range)  naproxen (NAPROSYN) tablet 500 mg (has no administration in time range)    ED Course/ Medical Decision Making/ A&P Clinical Course as of 09/29/22 2313  Thu Sep 29, 2022  2208 Urinalysis, Routine w reflex microscopic -Urine, Clean Catch Urinalysis without signs of infection [JK]  2257 Ultrasound without acute abnormality.  No evidence of torsion.  Varicocele noted [JK]    Clinical Course User Index [JK] Linwood Dibbles, MD                             Medical Decision Making Problems Addressed: Epididymitis: acute illness or injury that poses a threat to life or bodily functions  Amount and/or Complexity of Data Reviewed Labs: ordered. Decision-making details documented in ED Course. Radiology: ordered.  Risk Prescription drug management.   Patient presented to ED for evaluation of testicle pain.  No evidence of hernia on exam.  Ultrasound does not show evidence  of torsion.  Patient denies any new partners.  No symptoms concerning for STI.  Will treat for epididymitis.  Outpatient follow-up with urology.        Final Clinical Impression(s) / ED Diagnoses Final diagnoses:  Epididymitis    Rx / DC Orders ED Discharge Orders          Ordered    ciprofloxacin (CIPRO) 500 MG tablet  2 times daily        09/29/22 2312    naproxen (NAPROSYN) 375 MG tablet  2 times daily        09/29/22 2312              Linwood Dibbles, MD 09/29/22 2313

## 2022-09-29 NOTE — ED Notes (Signed)
Urinal given to pt 

## 2022-09-29 NOTE — ED Notes (Signed)
US at bedside

## 2022-09-30 LAB — HIV ANTIBODY (ROUTINE TESTING W REFLEX): HIV Screen 4th Generation wRfx: NONREACTIVE

## 2023-01-22 DIAGNOSIS — Z0289 Encounter for other administrative examinations: Secondary | ICD-10-CM

## 2023-01-23 ENCOUNTER — Ambulatory Visit (INDEPENDENT_AMBULATORY_CARE_PROVIDER_SITE_OTHER): Payer: No Typology Code available for payment source | Admitting: Adult Health

## 2023-01-23 ENCOUNTER — Encounter (INDEPENDENT_AMBULATORY_CARE_PROVIDER_SITE_OTHER): Payer: Self-pay | Admitting: Adult Health

## 2023-01-23 VITALS — BP 107/70 | HR 78 | Temp 98.1°F | Ht 73.5 in | Wt 329.0 lb

## 2023-01-23 DIAGNOSIS — E559 Vitamin D deficiency, unspecified: Secondary | ICD-10-CM | POA: Diagnosis not present

## 2023-01-23 DIAGNOSIS — Z6841 Body Mass Index (BMI) 40.0 and over, adult: Secondary | ICD-10-CM | POA: Diagnosis not present

## 2023-01-23 DIAGNOSIS — Z0000 Encounter for general adult medical examination without abnormal findings: Secondary | ICD-10-CM

## 2023-01-23 NOTE — Progress Notes (Signed)
Office: [phone]  /  Fax: [phone]   Initial Visit  Devin Smith was seen in clinic today to evaluate for obesity. He is interested in losing weight to improve overall health and reduce the risk of weight related complications. He presents today to review program treatment options, initial physical assessment, and evaluation.     He was referred by: PCP  When asked what else they would like to accomplish? He states: Adopt healthier eating patterns, Improve energy levels and physical activity, Improve existing medical conditions, Reduce number of medications, Improve quality of life, Improve appearance, and Current Weight 329 with corresponding BMI 42.38.  Goal to achieve <40. Weight 300 lbs with corresponding BMI 39.6  Weight history: He reports significant weight gain in 2014- after his 25 yr old father passed away from complications of Colon/Prostate Ca  When asked how has your weight affected you? He states: Contributed to medical problems, Contributed to orthopedic problems or mobility issues, Having fatigue, Having poor endurance, and Problems with eating patterns  Some associated conditions: Vitamin D Deficiency and Other: Worsening chronic back pain  Contributing factors: Family history of obesity, Consumption of highly palatable foods, Reduced physical activity, and Eating patterns  Weight promoting medications identified: Psychotropic medications  Current nutrition plan: None  Current level of physical activity: Walking  Current or previous pharmacotherapy: None  Response to medication: Never tried medications   Past medical history includes:   Past Medical History:  Diagnosis Date   Asthma    Cardiomegaly    Chest wall pain, chronic    Family history of kidney cancer    Family history of lung cancer    Family history of prostate cancer    Family history of stomach cancer    GERD (gastroesophageal reflux disease)    Pectus carinatum      Objective:    BP 107/70   Pulse 78   Temp 98.1 F (36.7 C)   Ht 6' 1.5" (1.867 m)   Wt (!) 329 lb (149.2 kg)   SpO2 98%   BMI 42.82 kg/m  He was weighed on the bioimpedance scale: Body mass index is 42.82 kg/m.  Peak Weight:329 , Body Fat%:33.2, Visceral Fat Rating:17, Weight trend over the last 12 months: Unchanged  General:  Alert, oriented and cooperative. Patient is in no acute distress.  Respiratory: Normal respiratory effort, no problems with respiration noted   Gait: able to ambulate independently  Mental Status: Normal mood and affect. Normal behavior. Normal judgment and thought content.   DIAGNOSTIC DATA REVIEWED:  BMET    Component Value Date/Time   NA 136 06/10/2020 0250   K 4.0 06/10/2020 0250   CL 100 06/10/2020 0250   CO2 26 06/10/2020 0250   GLUCOSE 108 (H) 06/10/2020 0250   BUN 9 06/10/2020 0250   CREATININE 1.22 06/10/2020 0250   CALCIUM 9.4 06/10/2020 0250   GFRNONAA >60 06/10/2020 0250   GFRAA >60 06/11/2018 1612   No results found for: "HGBA1C" No results found for: "INSULIN" CBC    Component Value Date/Time   WBC 7.4 06/09/2020 0922   RBC 4.83 06/09/2020 0922   HGB 14.7 06/09/2020 0922   HCT 41.1 06/09/2020 0922   PLT 388 06/09/2020 0922   MCV 85.1 06/09/2020 0922   MCH 30.4 06/09/2020 0922   MCHC 35.8 06/09/2020 0922   RDW 12.4 06/09/2020 0922   Iron/TIBC/Ferritin/ %Sat No results found for: "IRON", "TIBC", "FERRITIN", "IRONPCTSAT" Lipid Panel  No results found for: "CHOL", "TRIG", "HDL", "CHOLHDL", "  VLDL", "LDLCALC", "LDLDIRECT" Hepatic Function Panel     Component Value Date/Time   PROT 8.1 06/11/2018 1612   ALBUMIN 4.6 06/11/2018 1612   AST 27 06/11/2018 1612   ALT 35 06/11/2018 1612   ALKPHOS 48 06/11/2018 1612   BILITOT 0.4 06/11/2018 1612   No results found for: "TSH"   Assessment and Plan:   Healthcare maintenance  Vitamin D deficiency  Morbid obesity (HCC), Starting BMI 42.8  ESTABLISH WITH HWW   Obesity Treatment /  Action Plan:  Patient will work on garnering support from family and friends to begin weight loss journey. Will work on eliminating or reducing the presence of highly palatable, calorie dense foods in the home. Will complete provided nutritional and psychosocial assessment questionnaire before the next appointment. Will be scheduled for indirect calorimetry to determine resting energy expenditure in a fasting state.  This will allow [place] to create a reduced calorie, high-protein meal plan to promote loss of fat mass while preserving muscle mass. Counseled on the health benefits of losing 5%-15% of total body weight. Was counseled on nutritional approaches to weight loss and benefits of reducing processed foods and consuming plant-based foods and high quality protein as part of nutritional weight management. Was counseled on pharmacotherapy and role as an adjunct in weight management.   Obesity Education Performed Today:  He was weighed on the bioimpedance scale and results were discussed and documented in the synopsis.  We discussed obesity as a disease and the importance of a more detailed evaluation of all the factors contributing to the disease.  We discussed the importance of long term lifestyle changes which include nutrition, exercise and behavioral modifications as well as the importance of customizing this to his specific health and social needs.  We discussed the benefits of reaching a healthier weight to alleviate the symptoms of existing conditions and reduce the risks of the biomechanical, metabolic and psychological effects of obesity.  Dorrion Gandhi appears to be in the action stage of change and states they are ready to start intensive lifestyle modifications and behavioral modifications.  30 minutes was spent today on this visit including the above counseling, pre-visit chart review, and post-visit documentation.  Reviewed by clinician on day of visit: allergies, medications,  problem list, medical history, surgical history, family history, social history, and previous encounter notes pertinent to obesity diagnosis.   Aison Malveaux d. Dollie Bressi, NP-C

## 2023-02-28 ENCOUNTER — Ambulatory Visit (INDEPENDENT_AMBULATORY_CARE_PROVIDER_SITE_OTHER): Payer: No Typology Code available for payment source | Admitting: Internal Medicine

## 2023-02-28 ENCOUNTER — Ambulatory Visit (INDEPENDENT_AMBULATORY_CARE_PROVIDER_SITE_OTHER): Payer: No Typology Code available for payment source | Admitting: Family Medicine

## 2023-02-28 ENCOUNTER — Encounter (INDEPENDENT_AMBULATORY_CARE_PROVIDER_SITE_OTHER): Payer: Self-pay

## 2023-03-21 ENCOUNTER — Ambulatory Visit (INDEPENDENT_AMBULATORY_CARE_PROVIDER_SITE_OTHER): Payer: Self-pay | Admitting: Family Medicine

## 2023-05-05 ENCOUNTER — Emergency Department (HOSPITAL_COMMUNITY)
Admission: EM | Admit: 2023-05-05 | Discharge: 2023-05-05 | Disposition: A | Discharge status: Home / Self Care | Payer: 59 | Attending: Emergency Medicine | Admitting: Emergency Medicine

## 2023-05-05 ENCOUNTER — Other Ambulatory Visit: Payer: Self-pay

## 2023-05-05 ENCOUNTER — Encounter (HOSPITAL_COMMUNITY): Payer: Self-pay | Admitting: Emergency Medicine

## 2023-05-05 DIAGNOSIS — G8929 Other chronic pain: Secondary | ICD-10-CM | POA: Diagnosis not present

## 2023-05-05 DIAGNOSIS — M545 Low back pain, unspecified: Secondary | ICD-10-CM

## 2023-05-05 MED ORDER — HYDROCODONE-ACETAMINOPHEN 5-325 MG PO TABS: 1.0000 | ORAL_TABLET | ORAL | 0 refills | Status: DC | PRN

## 2023-05-05 MED ORDER — ACETAMINOPHEN 500 MG PO TABS
1000.0000 mg | ORAL_TABLET | Freq: Once | ORAL | Status: AC
  Administered 2023-05-05: 1000 mg via ORAL
  Filled 2023-05-05: qty 2

## 2023-05-05 MED ORDER — NAPROXEN 500 MG PO TABS: 500.0000 mg | ORAL_TABLET | Freq: Two times a day (BID) | ORAL | 0 refills | Status: DC

## 2023-05-05 MED ORDER — NAPROXEN 250 MG PO TABS
500.0000 mg | ORAL_TABLET | Freq: Once | ORAL | Status: AC
  Administered 2023-05-05: 500 mg via ORAL
  Filled 2023-05-05: qty 2

## 2023-05-05 MED ORDER — METHOCARBAMOL 750 MG PO TABS: 750.0000 mg | ORAL_TABLET | Freq: Four times a day (QID) | ORAL | 0 refills | Status: DC

## 2023-05-05 NOTE — ED Notes (Signed)
Patient c/o lumbar pain, notified EDP new order to give Tylenol

## 2023-05-05 NOTE — ED Provider Notes (Signed)
 [place] EMERGENCY DEPARTMENT AT [facility] Provider Note   CSN: [id number] Arrival date & time: 05/05/23  1642     History  Chief Complaint  Patient presents with   Back Pain    Devin Smith is a 28 y.o. male.  Patient to eD with low back pain. History of L5-S1 discopathy, awaiting surgery. No new symptoms today, but is in need of pain management. No bowel/bladder dysfunction. No weakness or numbness of the lower extremities.   The history is provided by the patient. No language interpreter was used.  Back Pain      Home Medications Prior to Admission medications   Medication Sig Start Date End Date Taking? Authorizing Provider  HYDROcodone-acetaminophen (NORCO/VICODIN) 5-325 MG tablet Take 1 tablet by mouth every 4 (four) hours as needed. 05/05/23  Yes Elpidio Anis, PA-C  methocarbamol (ROBAXIN-750) 750 MG tablet Take 1 tablet (750 mg total) by mouth 4 (four) times daily. 05/05/23  Yes Carsynn Bethune, PA-C  naproxen (NAPROSYN) 500 MG tablet Take 1 tablet (500 mg total) by mouth 2 (two) times daily. 05/05/23  Yes Elpidio Anis, PA-C  acetaminophen (TYLENOL) 325 MG tablet Take 650 mg by mouth every 6 (six) hours as needed (pain).    [provider]  ibuprofen (ADVIL) 200 MG tablet Take 600 mg by mouth every 6 (six) hours as needed for headache or moderate pain.    [provider]  ondansetron (ZOFRAN-ODT) 4 MG disintegrating tablet Take 4 mg by mouth 3 (three) times daily as needed. 01/22/20   [provider]  pantoprazole (PROTONIX) 40 MG tablet Take 40 mg by mouth 2 (two) times daily. 05/14/20   [provider]  sucralfate (CARAFATE) 1 GM/10ML suspension Take 1 g by mouth in the morning and at bedtime.    [provider]      Allergies    Patient has no known allergies.    Review of Systems   Review of Systems  Musculoskeletal:  Positive for back pain.    Physical Exam Updated Vital Signs BP 128/70 (BP  Location: Right Arm)   Pulse 82   Temp 98.7 F (37.1 C) (Oral)   Resp 20   SpO2 98%  Physical Exam Vitals and nursing note reviewed.  Constitutional:      Appearance: Normal appearance. He is obese.  Cardiovascular:     Rate and Rhythm: Normal rate.  Pulmonary:     Effort: Pulmonary effort is normal.  Abdominal:     Palpations: Abdomen is soft.     Tenderness: There is no abdominal tenderness.  Musculoskeletal:        General: Normal range of motion.     Comments: FROM LE's bilaterally with full strength.   Neurological:     Mental Status: He is alert and oriented to person, place, and time.     Sensory: No sensory deficit.     Deep Tendon Reflexes: Reflexes normal.     ED Results / Procedures / Treatments   Labs (all labs ordered are listed, but only abnormal results are displayed) Labs Reviewed - No data to display  EKG None  Radiology No results found.  Procedures Procedures    Medications Ordered in ED Medications  naproxen (NAPROSYN) tablet 500 mg (has no administration in time range)  acetaminophen (TYLENOL) tablet 1,000 mg (1,000 mg Oral Given 05/05/23 2143)    ED Course/ Medical Decision Making/ A&P  Medical Decision Making Risk OTC drugs.           Final Clinical Impression(s) / ED Diagnoses Final diagnoses:  Acute midline low back pain without sciatica  Chronic midline low back pain without sciatica    Rx / DC Orders ED Discharge Orders          Ordered    naproxen (NAPROSYN) 500 MG tablet  2 times daily        05/05/23 2253    methocarbamol (ROBAXIN-750) 750 MG tablet  4 times daily        05/05/23 2253    HYDROcodone-acetaminophen (NORCO/VICODIN) 5-325 MG tablet  Every 4 hours PRN        05/05/23 2253              Elpidio Anis, PA-C 05/05/23 2254    Bethann Berkshire, MD 05/07/23 1144

## 2023-05-05 NOTE — Discharge Instructions (Signed)
Rest the back as much as possible. Take the medications as prescribed. Do not take the Robxin or the Norco when having to drive as these can make you drowsy causing impairment.   Follow up with your doctor as needed for continued pain management.

## 2023-05-05 NOTE — ED Triage Notes (Signed)
Pt reports lower back pain. Pt states he has broken disc in back at L5 and S1. PT reports continued back pain. Has talked to surgeon who recommends weight loss in order to have the surgery.

## 2023-05-10 ENCOUNTER — Emergency Department (HOSPITAL_COMMUNITY): Payer: No Typology Code available for payment source

## 2023-05-10 ENCOUNTER — Other Ambulatory Visit: Payer: Self-pay

## 2023-05-10 ENCOUNTER — Emergency Department (HOSPITAL_COMMUNITY)
Admission: EM | Admit: 2023-05-10 | Discharge: 2023-05-10 | Disposition: A | Discharge status: Home / Self Care | Payer: No Typology Code available for payment source | Attending: Emergency Medicine | Admitting: Emergency Medicine

## 2023-05-10 ENCOUNTER — Encounter (HOSPITAL_COMMUNITY): Payer: Self-pay | Admitting: Emergency Medicine

## 2023-05-10 DIAGNOSIS — J101 Influenza due to other identified influenza virus with other respiratory manifestations: Secondary | ICD-10-CM | POA: Diagnosis not present

## 2023-05-10 DIAGNOSIS — R197 Diarrhea, unspecified: Secondary | ICD-10-CM | POA: Diagnosis not present

## 2023-05-10 DIAGNOSIS — N179 Acute kidney failure, unspecified: Secondary | ICD-10-CM | POA: Diagnosis not present

## 2023-05-10 DIAGNOSIS — R112 Nausea with vomiting, unspecified: Secondary | ICD-10-CM | POA: Diagnosis present

## 2023-05-10 DIAGNOSIS — E871 Hypo-osmolality and hyponatremia: Secondary | ICD-10-CM | POA: Insufficient documentation

## 2023-05-10 LAB — COMPREHENSIVE METABOLIC PANEL
ALT: 28 U/L (ref 0–44)
AST: 27 U/L (ref 15–41)
Albumin: 4.4 g/dL (ref 3.5–5.0)
Alkaline Phosphatase: 51 U/L (ref 38–126)
Anion gap: 12 (ref 5–15)
BUN: 13 mg/dL (ref 6–20)
CO2: 25 mmol/L (ref 22–32)
Calcium: 9.2 mg/dL (ref 8.9–10.3)
Chloride: 97 mmol/L — ABNORMAL LOW (ref 98–111)
Creatinine, Ser: 1.32 mg/dL — ABNORMAL HIGH (ref 0.61–1.24)
GFR, Estimated: >60 mL/min (ref >60)
Glucose, Bld: 94 mg/dL (ref 70–99)
Potassium: 4.2 mmol/L (ref 3.5–5.1)
Sodium: 134 mmol/L — ABNORMAL LOW (ref 135–145)
Total Bilirubin: 0.6 mg/dL (ref 0.0–1.2)
Total Protein: 7.7 g/dL (ref 6.5–8.1)

## 2023-05-10 LAB — CBC
HCT: 40.9 % (ref 39.0–52.0)
Hemoglobin: 13.8 g/dL (ref 13.0–17.0)
MCH: 29.4 pg (ref 26.0–34.0)
MCHC: 33.7 g/dL (ref 30.0–36.0)
MCV: 87 fL (ref 80.0–100.0)
Platelets: 259 10*3/uL (ref 150–400)
RBC: 4.7 MIL/uL (ref 4.22–5.81)
RDW: 11.9 % (ref 11.5–15.5)
WBC: 4.9 10*3/uL (ref 4.0–10.5)
nRBC: 0 % (ref 0.0–0.2)

## 2023-05-10 LAB — RESP PANEL BY RT-PCR (RSV, FLU A&B, COVID)  RVPGX2
Influenza A by PCR: POSITIVE — AB
Influenza B by PCR: NEGATIVE
Resp Syncytial Virus by PCR: NEGATIVE
SARS Coronavirus 2 by RT PCR: NEGATIVE

## 2023-05-10 LAB — LIPASE, BLOOD: Lipase: 26 U/L (ref 11–51)

## 2023-05-10 MED ORDER — KETOROLAC TROMETHAMINE 15 MG/ML IJ SOLN
15.0000 mg | Freq: Once | INTRAMUSCULAR | Status: AC
  Administered 2023-05-10: 15 mg via INTRAVENOUS
  Filled 2023-05-10: qty 1

## 2023-05-10 MED ORDER — SODIUM CHLORIDE 0.9 % IV BOLUS
1000.0000 mL | Freq: Once | INTRAVENOUS | Status: AC
  Administered 2023-05-10: 1000 mL via INTRAVENOUS

## 2023-05-10 MED ORDER — DICYCLOMINE HCL 10 MG/ML IM SOLN
20.0000 mg | Freq: Once | INTRAMUSCULAR | Status: AC
  Administered 2023-05-10: 20 mg via INTRAMUSCULAR
  Filled 2023-05-10: qty 2

## 2023-05-10 MED ORDER — ONDANSETRON 4 MG PO TBDP: 4.0000 mg | ORAL_TABLET | Freq: Three times a day (TID) | ORAL | 0 refills | Status: DC | PRN

## 2023-05-10 MED ORDER — PROMETHAZINE-DM 6.25-15 MG/5ML PO SYRP: 5.0000 mL | ORAL_SOLUTION | Freq: Four times a day (QID) | ORAL | 0 refills | Status: AC | PRN

## 2023-05-10 MED ORDER — ACETAMINOPHEN 500 MG PO TABS
1000.0000 mg | ORAL_TABLET | Freq: Once | ORAL | Status: AC
  Administered 2023-05-10: 1000 mg via ORAL
  Filled 2023-05-10: qty 2

## 2023-05-10 MED ORDER — ONDANSETRON HCL 4 MG/2ML IJ SOLN
4.0000 mg | Freq: Once | INTRAMUSCULAR | Status: AC
  Administered 2023-05-10: 4 mg via INTRAVENOUS
  Filled 2023-05-10: qty 2

## 2023-05-10 MED ORDER — NAPROXEN 500 MG PO TABS: 500.0000 mg | ORAL_TABLET | Freq: Two times a day (BID) | ORAL | 0 refills | Status: DC

## 2023-05-10 NOTE — Discharge Instructions (Signed)
 Please follow-up closely with your primary care doctor on an outpatient basis.  Return to emergency department immediately for any new or worsening symptoms.

## 2023-05-10 NOTE — ED Provider Notes (Signed)
 [place] EMERGENCY DEPARTMENT AT [facility] Provider Note   CSN: [id number] Arrival date & time: 05/10/23  1322     History  Chief Complaint  Patient presents with   Emesis    Devin Smith is a 28 y.o. male.  Patient is a 28 year old male with no known past medical history who presents to the emergency department the chief complaint of cough, congestion, generalized bodyaches, abdominal pain, nausea, vomiting, diarrhea which has been ongoing for approximate the past 2 days.  He denies any direct known sick contacts.  He has had no associated dizziness, lightheadedness or syncope.  There is been no associated melena or hematochezia.  He denies any associated chest pain or shortness of breath.   Emesis Associated symptoms: abdominal pain, chills, cough and fever        Home Medications Prior to Admission medications   Medication Sig Start Date End Date Taking? Authorizing Provider  acetaminophen (TYLENOL) 325 MG tablet Take 650 mg by mouth every 6 (six) hours as needed (pain).    [provider]  HYDROcodone-acetaminophen (NORCO/VICODIN) 5-325 MG tablet Take 1 tablet by mouth every 4 (four) hours as needed. 05/05/23   Elpidio Anis, PA-C  ibuprofen (ADVIL) 200 MG tablet Take 600 mg by mouth every 6 (six) hours as needed for headache or moderate pain.    [provider]  methocarbamol (ROBAXIN-750) 750 MG tablet Take 1 tablet (750 mg total) by mouth 4 (four) times daily. 05/05/23   Elpidio Anis, PA-C  naproxen (NAPROSYN) 500 MG tablet Take 1 tablet (500 mg total) by mouth 2 (two) times daily. 05/05/23   Elpidio Anis, PA-C  ondansetron (ZOFRAN-ODT) 4 MG disintegrating tablet Take 4 mg by mouth 3 (three) times daily as needed. 01/22/20   [provider]  pantoprazole (PROTONIX) 40 MG tablet Take 40 mg by mouth 2 (two) times daily. 05/14/20   [provider]  sucralfate (CARAFATE) 1 GM/10ML suspension Take 1 g by mouth in the morning  and at bedtime.    [provider]      Allergies    Patient has no known allergies.    Review of Systems   Review of Systems  Constitutional:  Positive for chills, fatigue and fever.  Respiratory:  Positive for cough.   Gastrointestinal:  Positive for abdominal pain and vomiting.  All other systems reviewed and are negative.   Physical Exam Updated Vital Signs BP 128/70 (BP Location: Right Arm)   Pulse (!) 105   Temp (!) 100.6 F (38.1 C) (Oral)   Resp 19   Ht 6\' 3"  (1.905 m)   Wt (!) 149.7 kg   SpO2 99%   BMI 41.25 kg/m  Physical Exam Vitals reviewed.  Constitutional:      Appearance: Normal appearance.  HENT:     Head: Normocephalic and atraumatic.     Nose: Nose normal.     Mouth/Throat:     Mouth: Mucous membranes are moist.  Eyes:     Extraocular Movements: Extraocular movements intact.     Conjunctiva/sclera: Conjunctivae normal.     Pupils: Pupils are equal, round, and reactive to light.  Cardiovascular:     Rate and Rhythm: Normal rate and regular rhythm.     Pulses: Normal pulses.     Heart sounds: Normal heart sounds.  Pulmonary:     Effort: Pulmonary effort is normal. No respiratory distress.     Breath sounds: Normal breath sounds. No stridor. No wheezing, rhonchi or rales.  Abdominal:     General: Abdomen is flat. Bowel sounds are normal.     Palpations: Abdomen is soft.     Comments: Mild diffuse tenderness  Musculoskeletal:        General: Normal range of motion.     Cervical back: Normal range of motion and neck supple.  Skin:    General: Skin is warm and dry.     Findings: No rash.  Neurological:     General: No focal deficit present.     Mental Status: He is alert and oriented to person, place, and time. Mental status is at baseline.  Psychiatric:        Mood and Affect: Mood normal.        Behavior: Behavior normal.        Thought Content: Thought content normal.        Judgment: Judgment normal.     ED Results /  Procedures / Treatments   Labs (all labs ordered are listed, but only abnormal results are displayed) Labs Reviewed  COMPREHENSIVE METABOLIC PANEL - Abnormal; Notable for the following components:      Result Value   Sodium 134 (*)    Chloride 97 (*)    Creatinine, Ser 1.32 (*)    All other components within normal limits  RESP PANEL BY RT-PCR (RSV, FLU A&B, COVID)  RVPGX2  LIPASE, BLOOD  CBC  URINALYSIS, ROUTINE W REFLEX MICROSCOPIC    EKG None  Radiology No results found.  Procedures Procedures    Medications Ordered in ED Medications  sodium chloride 0.9 % bolus 1,000 mL (has no administration in time range)  ondansetron (ZOFRAN) injection 4 mg (has no administration in time range)  ketorolac (TORADOL) 15 MG/ML injection 15 mg (has no administration in time range)  dicyclomine (BENTYL) injection 20 mg (has no administration in time range)  acetaminophen (TYLENOL) tablet 1,000 mg (has no administration in time range)    ED Course/ Medical Decision Making/ A&P                                 Medical Decision Making Amount and/or Complexity of Data Reviewed Radiology: ordered.  Risk OTC drugs. Prescription drug management.   This patient presents to the ED for concern of cough, congestion, fever, generalized malaise and fatigue, nausea, vomiting, diarrhea differential diagnosis includes acute viral syndrome, pneumonia, acute appendicitis, cholecystitis, bowel obstruction, diverticulitis, kidney stone    Additional history obtained:  Additional history obtained from none External records from outside source obtained and reviewed including none   Lab Tests:  I Ordered, and personally interpreted labs.  The pertinent results include: Mild acute kidney injury, mild hyponatremia, influenza A positive   Imaging Studies ordered:  I ordered imaging studies including chest x-ray I independently visualized and interpreted imaging which showed cardiopulmonary  process I agree with the radiologist interpretation   Medicines ordered and prescription drug management:  I ordered medication including IV fluids, Toradol, Tylenol, Zofran for body aches, fever, abdominal pain, nausea, vomiting Reevaluation of the patient after these medicines showed that the patient improved I have reviewed the patients home medicines and have made adjustments as needed   Problem List / ED Course:  Patient is doing very well at this time and is stable for discharge home.  Symptoms have greatly improved with treatment in the emergency department.  He has stable vital signs at this time with no indication for  sepsis and no associated hypoxia.  Discussed with patient we will plan for discharge home at this time.  He is positive for influenza A and has no other concerning lab abnormalities.  Chest x-ray demonstrates no indication for pneumonia.  Close follow-up with primary care doctor was discussed as well as strict return precautions for any new or worsening symptoms.  Patient voiced understanding had no additional questions.   Social Determinants of Health:  None           Final Clinical Impression(s) / ED Diagnoses Final diagnoses:  None    Rx / DC Orders ED Discharge Orders     None         Kathlen Mody 05/10/23 1921    Terrilee Files, MD 05/11/23 1010

## 2023-05-10 NOTE — ED Triage Notes (Signed)
 Pt c/o nausea & vomiting since Monday and unable to keep anything down. Pt also c/o cough.

## 2023-05-18 ENCOUNTER — Encounter (INDEPENDENT_AMBULATORY_CARE_PROVIDER_SITE_OTHER): Payer: Self-pay

## 2023-06-30 ENCOUNTER — Emergency Department (HOSPITAL_COMMUNITY)

## 2023-06-30 ENCOUNTER — Other Ambulatory Visit: Payer: Self-pay

## 2023-06-30 ENCOUNTER — Encounter (HOSPITAL_COMMUNITY): Payer: Self-pay

## 2023-06-30 ENCOUNTER — Emergency Department (HOSPITAL_COMMUNITY)
Admission: EM | Admit: 2023-06-30 | Discharge: 2023-06-30 | Disposition: A | Discharge status: Home / Self Care | Attending: Emergency Medicine | Admitting: Emergency Medicine

## 2023-06-30 DIAGNOSIS — R0789 Other chest pain: Secondary | ICD-10-CM | POA: Diagnosis not present

## 2023-06-30 DIAGNOSIS — I517 Cardiomegaly: Secondary | ICD-10-CM | POA: Diagnosis not present

## 2023-06-30 DIAGNOSIS — R079 Chest pain, unspecified: Secondary | ICD-10-CM | POA: Diagnosis present

## 2023-06-30 LAB — BASIC METABOLIC PANEL WITH GFR
Anion gap: 8 (ref 5–15)
BUN: 19 mg/dL (ref 6–20)
CO2: 24 mmol/L (ref 22–32)
Calcium: 9.2 mg/dL (ref 8.9–10.3)
Chloride: 103 mmol/L (ref 98–111)
Creatinine, Ser: 1.04 mg/dL (ref 0.61–1.24)
GFR, Estimated: >60 mL/min (ref >60)
Glucose, Bld: 92 mg/dL (ref 70–99)
Potassium: 4.2 mmol/L (ref 3.5–5.1)
Sodium: 135 mmol/L (ref 135–145)

## 2023-06-30 LAB — TROPONIN I (HIGH SENSITIVITY)
Troponin I (High Sensitivity): <2 ng/L (ref <18)
Troponin I (High Sensitivity): <2 ng/L (ref <18)

## 2023-06-30 LAB — CBC
HCT: 40.6 % (ref 39.0–52.0)
Hemoglobin: 13.9 g/dL (ref 13.0–17.0)
MCH: 29.4 pg (ref 26.0–34.0)
MCHC: 34.2 g/dL (ref 30.0–36.0)
MCV: 86 fL (ref 80.0–100.0)
Platelets: 326 10*3/uL (ref 150–400)
RBC: 4.72 MIL/uL (ref 4.22–5.81)
RDW: 12.4 % (ref 11.5–15.5)
WBC: 6.6 10*3/uL (ref 4.0–10.5)
nRBC: 0 % (ref 0.0–0.2)

## 2023-06-30 LAB — D-DIMER, QUANTITATIVE: D-Dimer, Quant: <0.27 ug{FEU}/mL (ref 0.00–0.50)

## 2023-06-30 MED ORDER — NAPROXEN 500 MG PO TABS: ORAL_TABLET | ORAL | 0 refills | Status: AC

## 2023-06-30 NOTE — ED Triage Notes (Addendum)
 Pt via [facility] chest pain on way to work this morning. Diarrhea last night. Vitals stable. Given Asprin.

## 2023-06-30 NOTE — ED Provider Notes (Signed)
 [place] EMERGENCY DEPARTMENT AT [facility] Provider Note   CSN: [id number] Arrival date & time: 06/30/23  1914     History {Add pertinent medical, surgical, social history, OB history to HPI:1} No chief complaint on file.   Devin Smith is a 28 y.o. male.  Patient complains of chest pain   Chest Pain      Home Medications Prior to Admission medications   Medication Sig Start Date End Date Taking? Authorizing Provider  naproxen  (NAPROSYN ) 500 MG tablet Take one every 12 hours as needed for pain 06/30/23  Yes Cheyenne Cotta, MD  acetaminophen  (TYLENOL ) 325 MG tablet Take 650 mg by mouth every 6 (six) hours as needed (pain).    [provider]  HYDROcodone -acetaminophen  (NORCO/VICODIN) 5-325 MG tablet Take 1 tablet by mouth every 4 (four) hours as needed. 05/05/23   Mandy Second, PA-C  ibuprofen  (ADVIL ) 200 MG tablet Take 600 mg by mouth every 6 (six) hours as needed for headache or moderate pain.    [provider]  methocarbamol  (ROBAXIN -750) 750 MG tablet Take 1 tablet (750 mg total) by mouth 4 (four) times daily. 05/05/23   Mandy Second, PA-C  ondansetron  (ZOFRAN -ODT) 4 MG disintegrating tablet Take 1 tablet (4 mg total) by mouth every 8 (eight) hours as needed for nausea or vomiting. 05/10/23   Roselynn Connors, PA-C  pantoprazole (PROTONIX) 40 MG tablet Take 40 mg by mouth 2 (two) times daily. 05/14/20   [provider]  promethazine -dextromethorphan (PROMETHAZINE -DM) 6.25-15 MG/5ML syrup Take 5 mLs by mouth 4 (four) times daily as needed. 05/10/23   Roselynn Connors, PA-C  sucralfate (CARAFATE) 1 GM/10ML suspension Take 1 g by mouth in the morning and at bedtime.    [provider]      Allergies    Patient has no known allergies.    Review of Systems   Review of Systems  Cardiovascular:  Positive for chest pain.    Physical Exam Updated Vital Signs BP 125/81   Pulse 73   Temp 97.7 F (36.5 C)   Resp 18    SpO2 97%  Physical Exam  ED Results / Procedures / Treatments   Labs (all labs ordered are listed, but only abnormal results are displayed) Labs Reviewed  BASIC METABOLIC PANEL WITH GFR  CBC  D-DIMER, QUANTITATIVE  TROPONIN I (HIGH SENSITIVITY)  TROPONIN I (HIGH SENSITIVITY)    EKG EKG Interpretation Date/Time:  Friday June 30 2023 06:39:10 EDT Ventricular Rate:  65 PR Interval:  143 QRS Duration:  96 QT Interval:  386 QTC Calculation: 402 R Axis:   76  Text Interpretation: Sinus rhythm Early repolarization Confirmed by Orvilla Blander ([id number]) on 06/30/2023 6:41:30 AM  Radiology DG Chest Portable 1 View Result Date: 06/30/2023 CLINICAL DATA:  Chest pain EXAM: PORTABLE CHEST 1 VIEW COMPARISON:  05/10/2023 FINDINGS: The cardio pericardial silhouette is enlarged. The lungs are clear without focal pneumonia, edema, pneumothorax or pleural effusion. No acute bony abnormality. Telemetry leads overlie the chest. IMPRESSION: Enlargement of the cardiopericardial silhouette without acute cardiopulmonary findings. Electronically Signed   By: Donnal Fusi M.D.   On: 06/30/2023 07:22    Procedures Procedures  {Document cardiac monitor, telemetry assessment procedure when appropriate:1}  Medications Ordered in ED Medications - No data to display  ED Course/ Medical Decision Making/ A&P   {   Click here for ABCD2, HEART and other calculatorsREFRESH Note before signing :1}  Medical Decision Making Amount and/or Complexity of Data Reviewed Labs: ordered. Radiology: ordered.  Risk Prescription drug management.  Patient with atypical chest pain and possible cardiomegaly.  He is referred to cardiology  {Document critical care time when appropriate:1} {Document review of labs and clinical decision tools ie heart score, Chads2Vasc2 etc:1}  {Document your independent review of radiology images, and any outside records:1} {Document your discussion with  family members, caretakers, and with consultants:1} {Document social determinants of health affecting pt's care:1} {Document your decision making why or why not admission, treatments were needed:1} Final Clinical Impression(s) / ED Diagnoses Final diagnoses:  Atypical chest pain  Cardiomegaly    Rx / DC Orders ED Discharge Orders          Ordered    naproxen  (NAPROSYN ) 500 MG tablet        06/30/23 0958    Ambulatory referral to Cardiology       Comments: If you have not heard from the Cardiology office within the next 72 hours please call [phone].   06/30/23 1027

## 2023-06-30 NOTE — Discharge Instructions (Addendum)
 Follow up with the cardiologist for recheck in a few weeks

## 2023-07-10 DIAGNOSIS — R0789 Other chest pain: Secondary | ICD-10-CM | POA: Insufficient documentation

## 2023-07-10 DIAGNOSIS — Q677 Pectus carinatum: Secondary | ICD-10-CM | POA: Insufficient documentation

## 2023-07-10 DIAGNOSIS — J45909 Unspecified asthma, uncomplicated: Secondary | ICD-10-CM | POA: Insufficient documentation

## 2023-07-10 DIAGNOSIS — K219 Gastro-esophageal reflux disease without esophagitis: Secondary | ICD-10-CM | POA: Insufficient documentation

## 2023-07-11 ENCOUNTER — Ambulatory Visit

## 2023-07-11 ENCOUNTER — Telehealth: Payer: Self-pay | Admitting: *Deleted

## 2023-07-11 ENCOUNTER — Encounter (HOSPITAL_COMMUNITY): Payer: Self-pay

## 2023-07-11 ENCOUNTER — Encounter: Payer: Self-pay | Admitting: Emergency Medicine

## 2023-07-11 VITALS — BP 130/92 | HR 67 | Ht 76.0 in | Wt 340.4 lb

## 2023-07-11 DIAGNOSIS — R0789 Other chest pain: Secondary | ICD-10-CM

## 2023-07-11 DIAGNOSIS — I517 Cardiomegaly: Secondary | ICD-10-CM

## 2023-07-11 DIAGNOSIS — Z9889 Other specified postprocedural states: Secondary | ICD-10-CM | POA: Insufficient documentation

## 2023-07-11 NOTE — Telephone Encounter (Signed)
 Per DPR gave instructions for ETT to pt's mother Wallene Gum.

## 2023-07-11 NOTE — Patient Instructions (Signed)
 Medication Instructions:  Your physician recommends that you continue on your current medications as directed. Please refer to the Current Medication list given to you today.  *If you need a refill on your cardiac medications before your next appointment, please call your pharmacy*   Lab Work: None Ordered If you have labs (blood work) drawn today and your tests are completely normal, you will receive your results only by: MyChart Message (if you have MyChart) OR A paper copy in the mail If you have any lab test that is abnormal or we need to change your treatment, we will call you to review the results.   Testing/Procedures: Echocardiogram An echocardiogram is a test that uses sound waves (ultrasound) to produce images of the heart. Images from an echocardiogram can provide important information about: Heart size and shape. The size and thickness and movement of your heart's walls. Heart muscle function and strength. Heart valve function or if you have stenosis. Stenosis is when the heart valves are too narrow. If blood is flowing backward through the heart valves (regurgitation). A tumor or infectious growth around the heart valves. Areas of heart muscle that are not working well because of poor blood flow or injury from a heart attack. Aneurysm detection. An aneurysm is a weak or damaged part of an artery wall. The wall bulges out from the normal force of blood pumping through the body. Tell a health care provider about: Any allergies you have. All medicines you are taking, including vitamins, herbs, eye drops, creams, and over-the-counter medicines. Any blood disorders you have. Any surgeries you have had. Any medical conditions you have. Whether you are pregnant or may be pregnant. What are the risks? Generally, this is a safe test. However, problems may occur, including an allergic reaction to dye (contrast) that may be used during the test. What happens before the test? No  specific preparation is needed. You may eat and drink normally. What happens during the test?  You will take off your clothes from the waist up and put on a hospital gown. Electrodes or electrocardiogram (ECG)patches may be placed on your chest. The electrodes or patches are then connected to a device that monitors your heart rate and rhythm. You will lie down on a table for an ultrasound exam. A gel will be applied to your chest to help sound waves pass through your skin. A handheld device, called a transducer, will be pressed against your chest and moved over your heart. The transducer produces sound waves that travel to your heart and bounce back (or "echo" back) to the transducer. These sound waves will be captured in real-time and changed into images of your heart that can be viewed on a video monitor. The images will be recorded on a computer and reviewed by your health care provider. You may be asked to change positions or hold your breath for a short time. This makes it easier to get different views or better views of your heart. In some cases, you may receive contrast through an IV in one of your veins. This can improve the quality of the pictures from your heart. The procedure may vary among health care providers and hospitals. What can I expect after the test? You may return to your normal, everyday life, including diet, activities, and medicines, unless your health care provider tells you not to do that. Follow these instructions at home: It is up to you to get the results of your test. Ask your health care provider,  or the department that is doing the test, when your results will be ready. Keep all follow-up visits. This is important. Summary An echocardiogram is a test that uses sound waves (ultrasound) to produce images of the heart. Images from an echocardiogram can provide important information about the size and shape of your heart, heart muscle function, heart valve function, and  other possible heart problems. You do not need to do anything to prepare before this test. You may eat and drink normally. After the echocardiogram is completed, you may return to your normal, everyday life, unless your health care provider tells you not to do that. This information is not intended to replace advice given to you by your health care provider. Make sure you discuss any questions you have with your health care provider. Document Revised: 11/11/2020 Document Reviewed: 10/22/2019 Elsevier Patient Education  2023 Elsevier Inc.     Treadmill Stress Test Instructions:    1. You may take all of your medications   2. No food, drink or tobacco products 2 hours prior to your test.  3. Dress prepared to exercise. Best to wear 2 piece outfit and tennis shoes. Shoes must be closed toe.  4. Please bring all current prescription medications.    Follow-Up: At [facility], you and your health needs are our priority.  As part of our continuing mission to provide you with exceptional heart care, we have created designated Provider Care Teams.  These Care Teams include your primary Cardiologist (physician) and Advanced Practice Providers (APPs -  Physician Assistants and Nurse Practitioners) who all work together to provide you with the care you need, when you need it.  We recommend signing up for the patient portal called "MyChart".  Sign up information is provided on this After Visit Summary.  MyChart is used to connect with patients for Virtual Visits (Telemedicine).  Patients are able to view lab/test results, encounter notes, upcoming appointments, etc.  Non-urgent messages can be sent to your provider as well.   To learn more about what you can do with MyChart, go to [email or web address].    Your next appointment:   Follow up as needed

## 2023-07-11 NOTE — Progress Notes (Signed)
 Cardiology Consultation:    Date:  07/11/2023   ID:  Devin Smith, DOB 03-06-1996, MRN [id number]  PCP:  Jolynn Needy, PA  Cardiologist:  Daymon Evans Chardai Gangemi, MD   Referring MD: Jolynn Needy, PA   No chief complaint on file.    ASSESSMENT AND PLAN:   [name] 28 year old with no significant prior cardiac history but significant history of pectus carinatum and hiatal hernia requiring surgical repair in 2013 and reports redo surgery in 2021 at [facility], also reports history of asthma, esophageal reflux disease, prior cholecystectomy in 2013, chronic abdominal pain follows up with gastroenterologist at [facility] and underwent EGD and colonoscopy 06-14-2023 that noted esophagitis.  Here for further evaluation of sharp atypical, mostly noncardiac sounding chest pain and further evaluation of enlarged cardiac silhouette on recent portable chest x-ray.  Problem List Items Addressed This Visit     Cardiomegaly   Cardiomegaly suspected on enlarged cardiac silhouette on portable chest x-ray 06/30/2023.   Reassured him about the findings and nonspecific nature given the possibility of enlarged cardiomediastinal silhouette on portable chest x-rays.  Will obtain transthoracic echocardiogram for cardiac structure and function assessment to rule out any significant abnormalities.      Atypical chest pain - Primary   Chest pain is noted above appears noncardiac in description. Noncardiac sounding chest pain. 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 No significant abnormalities on the EKG.  Will proceed with further evaluation with treadmill EKG stress test. He requests light duty recommendations, this is reasonable to continue with light duties until  evaluation is completed. Will request stress test to be expedited so he can return to activities without any limitation from cardiac standpoint if the results show no  significant abnormalities.       Relevant Orders   EKG 12-Lead (Completed)   ECHOCARDIOGRAM COMPLETE   Exercise Tolerance Test   Return to clinic based on test results.   History of Present Illness:    Devin Smith is a 28 y.o. male who is being seen today for the evaluation of chest pain and enlarged cardiac/pericardial silhouette at the request of Jolynn Needy, [place]. Follows up with gastroenterologist at [facility]. Pleasant man here for the visit by himself.  Works as a Public relations account executive at [employer].  History of asthma, hiatal hernia and pectus carinatum s/p surgical repair in 2013 and redo surgery in 2021 at [facility], ADD, prior cholecystectomy in 2013, esophageal reflux, esophagitis [EGD 06-14-2023, chronic abdominal pain [colonoscopy 06-14-2023].  No significant prior cardiac history  Was recently in the emergency room April 18 for further evaluation of sharp chest pain.  Hemodynamically stable during the study EKG unremarkable with early repolarization like changes in lead II.  High-sensitivity troponin I was negative less than 2 and less than 2. Unremarkable BMP, CBC.  D-dimer was unremarkable less than 0.27.  Portable chest x-ray 06/30/2023 reported enlarged cardiac silhouette.  He is referred here for further evaluation in the setting of chest x-ray findings.  Back in February he had an ER visit for fever associated with cough and congestion and generalized malaise and was empirically treated with Tylenol   Overall mentions he has been dealing with symptoms of chest pain that he describes as sharp which can come up unprovoked and can last for hours.  The pain is worse on leaning forwards or laying supine.  Relieved with laying on the left side. Denies  any recent chest wall injury. Denies any shortness of breath. Has musculoskeletal issues with back pain for which she uses Vicodin occasionally. Recently was started on baclofen for muscle relaxant.  No  other cardiac symptoms such as lightheadedness, syncopal episodes, pedal edema.  Does not smoke or vape. No substance abuse. Drinks alcohol occasionally no more than 1-2 beer drinks on the days he consumes.  EKG sinus rhythm heart rate 67/min, PR interval 152 ms, QRS duration 90 ms, QTc 409 ms  Prior lipid panel from 06-01-2023 total cholesterol 141, LDL 69, HDL 39, triglycerides 165. TSH normal 3.779.  Past Medical History:  Diagnosis Date   Asthma    Atypical chest pain    Cardiomegaly    Chest wall pain, chronic    Diarrhea 05/01/2019   Family history of colon cancer 12/19/2018   Family history of kidney cancer    Family history of lung cancer    Family history of prostate cancer    Family history of stomach cancer    GERD (gastroesophageal reflux disease)    Pectus carinatum    Rectal bleeding 12/19/2018   S/P Nissen fundoplication (without gastrostomy tube) procedure 06/09/2020    Past Surgical History:  Procedure Laterality Date    XI ROBOTIC ASSISTED HIATAL HERNIA REPAIR WITH FUNDOPLICATION WITH MESH (N/A Abdomen)  06/09/2020   BIOPSY  12/31/2018   Procedure: BIOPSY;  Surgeon: Alyce Jubilee, MD;  Location: [facility];  Service: Endoscopy;;  Ileal, Right , left colon biopsies    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   CHOLECYSTECTOMY     COLONOSCOPY WITH PROPOFOL  N/A 12/31/2018   Procedure: COLONOSCOPY WITH PROPOFOL ;  Surgeon: Alyce Jubilee, MD;  Location: [facility];  Service: Endoscopy;  Laterality: N/A;  10:15am   ESOPHAGEAL MANOMETRY N/A 05/11/2020   Procedure: ESOPHAGEAL MANOMETRY (EM);  Surgeon: Genell Ken, MD;  Location: [facility];  Service: Gastroenterology;  Laterality: N/A;   HERNIA REPAIR     INSERTION OF MESH N/A 06/09/2020   Procedure: INSERTION OF MESH;  Surgeon: Shela Derby, MD;  Location: [facility] OR;  Service: General;  Laterality: N/A;   Ravitch for pecuts carinatum on 10/14/2011     XI ROBOTIC ASSISTED HIATAL HERNIA REPAIR  N/A 06/09/2020   Procedure: XI ROBOTIC ASSISTED HIATAL HERNIA REPAIR WITH FUNDOPLICATION WITH MESH;  Surgeon: Shela Derby, MD;  Location: [facility] OR;  Service: General;  Laterality: N/A;   XI ROBOTIC ASSISTED LAPAROSCOPIC NISSEN FUNDOPLICATION (N/A Abdomen)  06/09/2020    Current Medications: Current Meds  Medication Sig   acetaminophen  (TYLENOL ) 325 MG tablet Take 650 mg by mouth every 6 (six) hours as needed for mild pain (pain score 1-3) or moderate pain (pain score 4-6) (pain).   baclofen (LIORESAL) 10 MG tablet Take 10 mg by mouth at bedtime.   ibuprofen  (ADVIL ) 200 MG tablet Take 600 mg by mouth every 6 (six) hours as needed for headache or moderate pain.   methocarbamol  (ROBAXIN -750) 750 MG tablet Take 1 tablet (750 mg total) by mouth 4 (four) times daily.   naproxen  (NAPROSYN ) 500 MG tablet Take one every 12 hours as needed for pain   pantoprazole (PROTONIX) 40 MG tablet Take 40 mg by mouth 2 (two) times daily.   promethazine -dextromethorphan (PROMETHAZINE -DM) 6.25-15 MG/5ML syrup Take 5 mLs by mouth 4 (four) times daily as needed.   sucralfate (CARAFATE) 1 GM/10ML suspension Take 1 g by mouth in the morning and at bedtime.   VOQUEZNA 20 MG TABS  Take 1 tablet by mouth daily.     Allergies:   Patient has no known allergies.   Social History   Socioeconomic History   Marital status: Legally Separated    Spouse name: Not on file   Number of children: Not on file   Years of education: Not on file   Highest education level: Not on file  Occupational History   Not on file  Tobacco Use   Smoking status: Never   Smokeless tobacco: Never  Vaping Use   Vaping status: Never Used  Substance and Sexual Activity   Alcohol use: Yes    Comment: occ. use   Drug use: No   Sexual activity: Not on file  Other Topics Concern   Not on file  Social History Narrative   Not on file   Social Drivers of Health   Financial Resource Strain: Not on file  Food Insecurity: No Food Insecurity  (04/22/2020)   Received from [facility], [facility]   Hunger Vital Sign    Worried About Running Out of Food in the Last Year: Never true    Ran Out of Food in the Last Year: Never true  Transportation Needs: Not on file  Physical Activity: Not on file  Stress: Not on file  Social Connections: Unknown (07/26/2021)   Received from [facility], [facility]   Social Network    Social Network: Not on file     Family History: The patient's family history includes Brain cancer (age of onset: 32) in his father; Diabetes in his mother; Heart disease in his maternal grandmother and paternal grandmother; Heart failure in his maternal grandmother and paternal grandmother; Hyperlipidemia in his father and mother; Hypertension in his father and mother; Kidney cancer (age of onset: 26) in his mother; Lung cancer (age of onset: 38) in his maternal grandmother and paternal grandmother; Prostate cancer (age of onset: 18) in his father; Stomach cancer (age of onset: 38) in his half-brother; Stomach cancer (age of onset: 91) in his father; Stomach cancer (age of onset: 3) in his paternal uncle; Thyroid disease in his sister. ROS:   Please see the history of present illness.    All 14 point review of systems negative except as described per history of present illness.  EKGs/Labs/Other Studies Reviewed:    The following studies were reviewed today:   EKG:  EKG Interpretation Date/Time:  Tuesday July 11 2023 08:32:01 EDT Ventricular Rate:  67 PR Interval:  152 QRS Duration:  90 QT Interval:  388 QTC Calculation: 409 R Axis:   87  Text Interpretation: Normal sinus rhythm Normal ECG When compared with ECG of 30-Jun-2023 06:39, PREVIOUS ECG IS PRESENT Confirmed by Bertha Broad reddy [phone]) on 07/11/2023 8:43:16 AM    Recent Labs: 05/10/2023: ALT 28 06/30/2023: BUN 19; Creatinine, Ser 1.04; Hemoglobin 13.9; Platelets 326; Potassium 4.2; Sodium 135  Recent Lipid Panel No results found for:  "CHOL", "TRIG", "HDL", "CHOLHDL", "VLDL", "LDLCALC", "LDLDIRECT"  Physical Exam:    VS:  BP (!) 130/92   Pulse 67   Ht 6\' 4"  (1.93 m)   Wt (!) 340 lb 6.4 oz (154.4 kg)   SpO2 93%   BMI 41.43 kg/m     Wt Readings from Last 3 Encounters:  07/11/23 (!) 340 lb 6.4 oz (154.4 kg)  05/10/23 (!) 330 lb (149.7 kg)  01/23/23 (!) 329 lb (149.2 kg)     GENERAL:  Well nourished, well developed in no acute distress NECK: No JVD;  No carotid bruits CARDIAC: Lower anterior chest wall horizontal surgical scar.  RRR, S1 and S2 present, no murmurs, no rubs, no gallops CHEST:  Clear to auscultation without rales, wheezing or rhonchi  Extremities: No pitting pedal edema. Pulses bilaterally symmetric with radial 2+ and dorsalis pedis 2+ NEUROLOGIC:  Alert and oriented x 3  Medication Adjustments/Labs and Tests Ordered: Current medicines are reviewed at length with the patient today.  Concerns regarding medicines are outlined above.  Orders Placed This Encounter  Procedures   Exercise Tolerance Test   EKG 12-Lead   ECHOCARDIOGRAM COMPLETE   No orders of the defined types were placed in this encounter.   Signed, Lura Sallies, MD, MPH, [facility] 07/11/2023 9:17 AM    [facility]

## 2023-07-11 NOTE — Assessment & Plan Note (Signed)
 Chest pain is noted above appears noncardiac in description. Noncardiac sounding chest pain. 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 No significant abnormalities on the EKG.  Will proceed with further evaluation with treadmill EKG stress test. He requests light duty recommendations, this is reasonable to continue with light duties until  evaluation is completed. Will request stress test to be expedited so he can return to activities without any limitation from cardiac standpoint if the results show no significant abnormalities.

## 2023-07-11 NOTE — Assessment & Plan Note (Signed)
 Cardiomegaly suspected on enlarged cardiac silhouette on portable chest x-ray 06/30/2023.   Reassured him about the findings and nonspecific nature given the possibility of enlarged cardiomediastinal silhouette on portable chest x-rays.  Will obtain transthoracic echocardiogram for cardiac structure and function assessment to rule out any significant abnormalities.

## 2023-07-13 ENCOUNTER — Telehealth: Payer: Self-pay

## 2023-07-13 ENCOUNTER — Encounter: Payer: Self-pay | Admitting: Emergency Medicine

## 2023-07-13 ENCOUNTER — Ambulatory Visit (HOSPITAL_COMMUNITY): Attending: Cardiology

## 2023-07-13 DIAGNOSIS — R0789 Other chest pain: Secondary | ICD-10-CM | POA: Diagnosis present

## 2023-07-13 LAB — EXERCISE TOLERANCE TEST
Angina Index: 2
Duke Treadmill Score: 0
Estimated workload: 10.1
Exercise duration (min): 8 min
Exercise duration (sec): 15 s
MPHR: 194 {beats}/min
Peak HR: 136 {beats}/min
Percent HR: 70 %
Rest HR: 68 {beats}/min
ST Depression (mm): 0 mm

## 2023-07-13 NOTE — Telephone Encounter (Signed)
 Pt said he received a message in his my chart that it is ok to returning to work but he is questioning this because he is not through with all the testing.Please advise

## 2023-07-13 NOTE — Progress Notes (Signed)
 Sent a note to him regarding stress test results. Stress test, submaximal effort, no abnormal findings at the peak heart rate attained which was 70% of MPHR.  Okay to resume his activities as tolerated.  No limitations from cardiac standpoint. Please send out a work release for him.  Thank you

## 2023-07-13 NOTE — Telephone Encounter (Signed)
 Called and spoke to patient. Reviewed that Dr. Ronell Coe recommendation is that patient can return to normal activities from a cardiac standpoint due to stress test being normal. Reviewed that Dr. Ronell Coe will review his echo once available. Patient verbalized understanding and had no further questions

## 2023-08-02 ENCOUNTER — Ambulatory Visit (HOSPITAL_COMMUNITY)
Admission: RE | Admit: 2023-08-02 | Discharge: 2023-08-02 | Disposition: A | Discharge status: Home / Self Care | Source: Ambulatory Visit

## 2023-08-02 DIAGNOSIS — R0789 Other chest pain: Secondary | ICD-10-CM | POA: Diagnosis present

## 2023-08-02 LAB — ECHOCARDIOGRAM COMPLETE
Area-P 1/2: 4.4 cm2
S' Lateral: 2.8 cm

## 2023-08-02 MED ORDER — PERFLUTREN LIPID MICROSPHERE
1.0000 mL | INTRAVENOUS | Status: AC | PRN
  Administered 2023-08-02: 1 mL via INTRAVENOUS

## 2023-08-09 ENCOUNTER — Ambulatory Visit: Payer: Self-pay

## 2024-01-10 NOTE — Telephone Encounter (Signed)
 LVM explaining that we have attempted to update his insurance verification in preporation for his visit w/[employer] on 10/30. The [employer] state health plan on file response is indicating that the plan is no longer active. Asked that he give us  a call back to discuss insurance coverage status.

## 2024-01-22 ENCOUNTER — Encounter (HOSPITAL_COMMUNITY): Payer: Self-pay

## 2024-01-22 ENCOUNTER — Emergency Department (HOSPITAL_COMMUNITY): Payer: Self-pay

## 2024-01-22 ENCOUNTER — Other Ambulatory Visit: Payer: Self-pay

## 2024-01-22 ENCOUNTER — Emergency Department (HOSPITAL_COMMUNITY)
Admission: EM | Admit: 2024-01-22 | Discharge: 2024-01-22 | Disposition: A | Discharge status: Home / Self Care | Payer: Self-pay | Attending: Emergency Medicine | Admitting: Emergency Medicine

## 2024-01-22 DIAGNOSIS — M5441 Lumbago with sciatica, right side: Secondary | ICD-10-CM | POA: Insufficient documentation

## 2024-01-22 DIAGNOSIS — Y92511 Restaurant or cafe as the place of occurrence of the external cause: Secondary | ICD-10-CM | POA: Insufficient documentation

## 2024-01-22 DIAGNOSIS — W1839XA Other fall on same level, initial encounter: Secondary | ICD-10-CM | POA: Insufficient documentation

## 2024-01-22 MED ORDER — METHYLPREDNISOLONE 4 MG PO TBPK: ORAL_TABLET | ORAL | 0 refills | Status: AC

## 2024-01-22 MED ORDER — OXYCODONE-ACETAMINOPHEN 5-325 MG PO TABS: 1.0000 | ORAL_TABLET | Freq: Four times a day (QID) | ORAL | 0 refills | Status: AC | PRN

## 2024-01-22 MED ORDER — METHOCARBAMOL 750 MG PO TABS: 750.0000 mg | ORAL_TABLET | Freq: Four times a day (QID) | ORAL | 0 refills | Status: AC

## 2024-01-22 MED ORDER — KETOROLAC TROMETHAMINE 15 MG/ML IJ SOLN
15.0000 mg | Freq: Once | INTRAMUSCULAR | Status: AC
  Administered 2024-01-22: 15 mg via INTRAMUSCULAR
  Filled 2024-01-22: qty 1

## 2024-01-22 NOTE — ED Triage Notes (Signed)
 Pt c/o back pain for the past couple months. Pt states pain is getting worse at this time. Pt ambulatory in triage.

## 2024-01-22 NOTE — ED Provider Notes (Signed)
 [place] EMERGENCY DEPARTMENT AT [facility] Provider Note   CSN: [id number] Arrival date & time: 01/22/24  1206     Patient presents with: Back Pain   Devin Smith is a 28 y.o. male.    Back Pain Patient presents as low back pain.  Has had for a couple weeks but does have a history of some chronic back pain.  States he fell in a [facility] [facility].  Unsure exactly how he landed but has had increasing pain since.  No relief with Motrin  or Tylenol .  Does now have pain radiating down the right leg.  No weakness.  No numbness.  No cancer history.  No IV drug use history.     Prior to Admission medications   Medication Sig Start Date End Date Taking? Authorizing Provider  methylPREDNISolone  (MEDROL  DOSEPAK) 4 MG TBPK tablet Use per package directions 01/22/24  Yes Patsey Lot, MD  oxyCODONE -acetaminophen  (PERCOCET/ROXICET) 5-325 MG tablet Take 1 tablet by mouth every 6 (six) hours as needed for severe pain (pain score 7-10). 01/22/24  Yes Patsey Lot, MD  acetaminophen  (TYLENOL ) 325 MG tablet Take 650 mg by mouth every 6 (six) hours as needed for mild pain (pain score 1-3) or moderate pain (pain score 4-6) (pain).    [provider]  ibuprofen  (ADVIL ) 200 MG tablet Take 600 mg by mouth every 6 (six) hours as needed for headache or moderate pain.    [provider]  methocarbamol  (ROBAXIN -750) 750 MG tablet Take 1 tablet (750 mg total) by mouth 4 (four) times daily. 01/22/24   Patsey Lot, MD  naproxen  (NAPROSYN ) 500 MG tablet Take one every 12 hours as needed for pain 06/30/23   Suzette Pac, MD  pantoprazole (PROTONIX) 40 MG tablet Take 40 mg by mouth 2 (two) times daily. 05/14/20   [provider]  promethazine -dextromethorphan (PROMETHAZINE -DM) 6.25-15 MG/5ML syrup Take 5 mLs by mouth 4 (four) times daily as needed. 05/10/23   Daralene Lonni BIRCH, PA-C  sucralfate (CARAFATE) 1 GM/10ML suspension Take 1 g by mouth in the morning and at bedtime.     [provider]  VOQUEZNA 20 MG TABS Take 1 tablet by mouth daily. 07/06/23   [provider]    Allergies: Patient has no known allergies.    Review of Systems  Musculoskeletal:  Positive for back pain.    Updated Vital Signs BP 130/68   Pulse 75   Temp 97.9 F (36.6 C) (Oral)   Resp 16   Ht 6' 4 (1.93 m)   Wt (!) 158.8 kg   SpO2 98%   BMI 42.60 kg/m   Physical Exam Vitals reviewed.  Constitutional:      Appearance: Normal appearance.  Abdominal:     Tenderness: There is no abdominal tenderness.  Musculoskeletal:     Comments: Tenderness over lumbar spine.  No step-off.  Does have pain with straight leg raise on the right.  Neurovasc intact in lower extremities.  Neurological:     Mental Status: He is alert.     (all labs ordered are listed, but only abnormal results are displayed) Labs Reviewed - No data to display  EKG: None  Radiology: CT Lumbar Spine Wo Contrast Result Date: 01/22/2024 EXAM: CT OF THE LUMBAR SPINE WITHOUT CONTRAST 01/22/2024 01:52:50 PM TECHNIQUE: CT of the lumbar spine was performed without the administration of intravenous contrast. Multiplanar reformatted images are provided for review. Automated exposure control, iterative reconstruction, and/or weight based adjustment of the mA/kV was utilized to  reduce the radiation dose to as low as reasonably achievable. COMPARISON: CT abdomen 04/17/2020. CLINICAL HISTORY: Back trauma, no prior imaging (Age >= 16y). FINDINGS: BONES AND ALIGNMENT: Alignment is maintained. Normal vertebral body heights. No acute fracture or suspicious bone lesion. Schmorl nodes and subtle endplate irregularity at multiple levels, similar to the prior CT. Endplate changes are most pronounced at L5-S1. The S1 superior endplate is not well visualized on the prior study. 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 The lowest well-formed disc space is labeled L5-S1. DEGENERATIVE CHANGES: Vacuum disc phenomenon at  L5-S1. Small disc bulge at the L5-S1 level. No significant osseous spinal canal stenosis in the lumbar spine. No significant foraminal stenosis. SOFT TISSUES: The paraspinal soft tissues are unremarkable. IMPRESSION: 1. No evidence of acute traumatic injury. 2. Small disc bulge at L5-S1 without significant spinal canal or foraminal stenosis. 3. Degenerative changes with Schmorl nodes and subtle endplate irregularities, most pronounced at L5-S1. Findings are overall similar to CT from 2022 and could reflect early degenerative changes or sequelae of Scheuermann disease. Electronically signed by: Donnice Mania MD 01/22/2024 02:05 PM EST RP Workstation: [id number]     Procedures   Medications Ordered in the ED  ketorolac  (TORADOL ) 15 MG/ML injection 15 mg (15 mg Intramuscular Given 01/22/24 1333)                                    Medical Decision Making Amount and/or Complexity of Data Reviewed Radiology: ordered.  Risk Prescription drug management.   Patient with low back pain after fall.  Does have radicular symptoms down the right leg.  No neurodeficits.  Does have traumatic cause and I think patient benefit from imaging.  Will get CT scan to evaluate.  Will treat symptomatically  CT scan shows some chronic changes.  No acute fracture.  Appears stable discharge home with symptomatic treatment    Final diagnoses:  Acute midline low back pain with right-sided sciatica    ED Discharge Orders          Ordered    methylPREDNISolone  (MEDROL  DOSEPAK) 4 MG TBPK tablet        01/22/24 1425    methocarbamol  (ROBAXIN -750) 750 MG tablet  4 times daily        01/22/24 1425    oxyCODONE -acetaminophen  (PERCOCET/ROXICET) 5-325 MG tablet  Every 6 hours PRN        01/22/24 1425               Patsey Lot, MD 01/22/24 1957
